# Patient Record
Sex: Female | Born: 1985 | Race: White | Hispanic: No | Smoking: Current every day smoker
Health system: Southern US, Community
[De-identification: ages and names within clinical notes are randomized; demographics above are authoritative.]

## PROBLEM LIST (undated history)

## (undated) ENCOUNTER — Inpatient Hospital Stay (HOSPITAL_COMMUNITY): Payer: Self-pay

## (undated) DIAGNOSIS — M5136 Other intervertebral disc degeneration, lumbar region: Secondary | ICD-10-CM

## (undated) DIAGNOSIS — N390 Urinary tract infection, site not specified: Secondary | ICD-10-CM

## (undated) DIAGNOSIS — F329 Major depressive disorder, single episode, unspecified: Secondary | ICD-10-CM

## (undated) DIAGNOSIS — O34219 Maternal care for unspecified type scar from previous cesarean delivery: Secondary | ICD-10-CM

## (undated) DIAGNOSIS — J45909 Unspecified asthma, uncomplicated: Secondary | ICD-10-CM

## (undated) DIAGNOSIS — F32A Depression, unspecified: Secondary | ICD-10-CM

## (undated) DIAGNOSIS — B029 Zoster without complications: Secondary | ICD-10-CM

## (undated) DIAGNOSIS — IMO0002 Reserved for concepts with insufficient information to code with codable children: Secondary | ICD-10-CM

## (undated) DIAGNOSIS — M51369 Other intervertebral disc degeneration, lumbar region without mention of lumbar back pain or lower extremity pain: Secondary | ICD-10-CM

## (undated) HISTORY — PX: KNEE SURGERY: SHX244

## (undated) HISTORY — DX: Unspecified asthma, uncomplicated: J45.909

## (undated) HISTORY — DX: Zoster without complications: B02.9

## (undated) HISTORY — DX: Maternal care for unspecified type scar from previous cesarean delivery: O34.219

## (undated) HISTORY — DX: Depression, unspecified: F32.A

## (undated) HISTORY — DX: Major depressive disorder, single episode, unspecified: F32.9

## (undated) SURGERY — Surgical Case
Anesthesia: Regional

---
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
## 2008-08-04 HISTORY — PX: DILATION AND CURETTAGE OF UTERUS: SHX78

## 2009-09-18 ENCOUNTER — Other Ambulatory Visit: Payer: Self-pay | Admitting: Emergency Medicine

## 2009-09-18 ENCOUNTER — Inpatient Hospital Stay (HOSPITAL_COMMUNITY): Admission: AD | Admit: 2009-09-18 | Discharge: 2009-09-20 | Payer: Self-pay | Admitting: Obstetrics & Gynecology

## 2009-09-18 ENCOUNTER — Ambulatory Visit: Payer: Self-pay | Admitting: Family

## 2009-12-12 ENCOUNTER — Ambulatory Visit: Payer: Self-pay | Admitting: Family Medicine

## 2009-12-12 ENCOUNTER — Inpatient Hospital Stay (HOSPITAL_COMMUNITY): Admission: AD | Admit: 2009-12-12 | Discharge: 2009-12-12 | Payer: Self-pay | Admitting: Obstetrics & Gynecology

## 2009-12-27 ENCOUNTER — Inpatient Hospital Stay (HOSPITAL_COMMUNITY): Admission: AD | Admit: 2009-12-27 | Discharge: 2009-12-27 | Payer: Self-pay | Admitting: Obstetrics and Gynecology

## 2010-01-02 ENCOUNTER — Inpatient Hospital Stay (HOSPITAL_COMMUNITY): Admission: RE | Admit: 2010-01-02 | Discharge: 2010-01-04 | Payer: Self-pay | Admitting: Obstetrics and Gynecology

## 2010-02-01 ENCOUNTER — Emergency Department (HOSPITAL_COMMUNITY): Admission: EM | Admit: 2010-02-01 | Discharge: 2010-02-01 | Payer: Self-pay | Admitting: Emergency Medicine

## 2010-03-27 ENCOUNTER — Emergency Department (HOSPITAL_COMMUNITY): Admission: EM | Admit: 2010-03-27 | Discharge: 2010-03-27 | Payer: Self-pay | Admitting: Emergency Medicine

## 2010-04-02 ENCOUNTER — Emergency Department (HOSPITAL_COMMUNITY): Admission: EM | Admit: 2010-04-02 | Discharge: 2010-04-02 | Payer: Self-pay | Admitting: Emergency Medicine

## 2010-06-19 ENCOUNTER — Emergency Department (HOSPITAL_COMMUNITY): Admission: EM | Admit: 2010-06-19 | Discharge: 2010-06-19 | Payer: Self-pay | Admitting: Emergency Medicine

## 2010-10-15 LAB — CBC
Hemoglobin: 11.9 g/dL — ABNORMAL LOW (ref 12.0–15.0)
MCH: 28.8 pg (ref 26.0–34.0)
MCHC: 33.2 g/dL (ref 30.0–36.0)
Platelets: 247 10*3/uL (ref 150–400)
RBC: 4.11 MIL/uL (ref 3.87–5.11)

## 2010-10-15 LAB — COMPREHENSIVE METABOLIC PANEL
ALT: 12 U/L (ref 0–35)
AST: 17 U/L (ref 0–37)
Albumin: 4.4 g/dL (ref 3.5–5.2)
CO2: 25 mEq/L (ref 19–32)
Calcium: 9.5 mg/dL (ref 8.4–10.5)
Creatinine, Ser: 0.99 mg/dL (ref 0.4–1.2)
GFR calc Af Amer: >60 mL/min (ref >60)
GFR calc non Af Amer: >60 mL/min (ref >60)
Sodium: 137 mEq/L (ref 135–145)
Total Protein: 6.9 g/dL (ref 6.0–8.3)

## 2010-10-15 LAB — DIFFERENTIAL
Eosinophils Absolute: 0.2 10*3/uL (ref 0.0–0.7)
Eosinophils Relative: 2 % (ref 0–5)
Lymphocytes Relative: 28 % (ref 12–46)
Lymphs Abs: 2.4 10*3/uL (ref 0.7–4.0)
Monocytes Absolute: 0.5 10*3/uL (ref 0.1–1.0)
Monocytes Relative: 6 % (ref 3–12)

## 2010-10-15 LAB — LIPASE, BLOOD: Lipase: 37 U/L (ref 11–59)

## 2010-10-15 LAB — POCT PREGNANCY, URINE: Preg Test, Ur: NEGATIVE

## 2010-10-18 LAB — URINALYSIS, ROUTINE W REFLEX MICROSCOPIC
Bilirubin Urine: NEGATIVE
Glucose, UA: NEGATIVE mg/dL
Ketones, ur: NEGATIVE mg/dL
Ketones, ur: NEGATIVE mg/dL
Nitrite: NEGATIVE
Nitrite: POSITIVE — AB
Protein, ur: NEGATIVE mg/dL
Specific Gravity, Urine: 1.015 (ref 1.005–1.030)
pH: 6.5 (ref 5.0–8.0)

## 2010-10-18 LAB — BASIC METABOLIC PANEL
CO2: 24 mEq/L (ref 19–32)
Calcium: 9.8 mg/dL (ref 8.4–10.5)
Creatinine, Ser: 0.87 mg/dL (ref 0.4–1.2)
GFR calc Af Amer: >60 mL/min (ref >60)
GFR calc non Af Amer: >60 mL/min (ref >60)
Sodium: 138 mEq/L (ref 135–145)

## 2010-10-18 LAB — URINE MICROSCOPIC-ADD ON

## 2010-10-18 LAB — DIFFERENTIAL
Basophils Relative: 0 % (ref 0–1)
Eosinophils Absolute: 0.2 10*3/uL (ref 0.0–0.7)
Lymphs Abs: 3 10*3/uL (ref 0.7–4.0)
Neutro Abs: 5.4 10*3/uL (ref 1.7–7.7)
Neutrophils Relative %: 59 % (ref 43–77)

## 2010-10-18 LAB — PREGNANCY, URINE: Preg Test, Ur: NEGATIVE

## 2010-10-18 LAB — CBC
MCH: 29.3 pg (ref 26.0–34.0)
MCHC: 33.4 g/dL (ref 30.0–36.0)
Platelets: 225 10*3/uL (ref 150–400)
RBC: 4.63 MIL/uL (ref 3.87–5.11)

## 2010-10-21 LAB — CBC
MCHC: 35.3 g/dL (ref 30.0–36.0)
Platelets: 157 10*3/uL (ref 150–400)
RBC: 3.39 MIL/uL — ABNORMAL LOW (ref 3.87–5.11)
RDW: 14 % (ref 11.5–15.5)
WBC: 12 10*3/uL — ABNORMAL HIGH (ref 4.0–10.5)

## 2010-10-21 LAB — URINALYSIS, ROUTINE W REFLEX MICROSCOPIC
Glucose, UA: NEGATIVE mg/dL
Protein, ur: NEGATIVE mg/dL
Specific Gravity, Urine: 1.02 (ref 1.005–1.030)
pH: 7.5 (ref 5.0–8.0)

## 2010-10-21 LAB — RPR: RPR Ser Ql: NONREACTIVE

## 2010-10-22 LAB — URINALYSIS, ROUTINE W REFLEX MICROSCOPIC
Ketones, ur: NEGATIVE mg/dL
Nitrite: NEGATIVE
pH: 7 (ref 5.0–8.0)

## 2010-10-24 LAB — ABO/RH: ABO/RH(D): A POS

## 2010-10-24 LAB — DIFFERENTIAL
Lymphocytes Relative: 12 % (ref 12–46)
Lymphs Abs: 1.9 10*3/uL (ref 0.7–4.0)
Neutro Abs: 13.9 10*3/uL — ABNORMAL HIGH (ref 1.7–7.7)
Neutrophils Relative %: 83 % — ABNORMAL HIGH (ref 43–77)

## 2010-10-24 LAB — BASIC METABOLIC PANEL
BUN: 4 mg/dL — ABNORMAL LOW (ref 6–23)
Creatinine, Ser: 0.49 mg/dL (ref 0.4–1.2)
GFR calc non Af Amer: >60 mL/min (ref >60)
Potassium: 3.7 mEq/L (ref 3.5–5.1)

## 2010-10-24 LAB — CBC
HCT: 37 % (ref 36.0–46.0)
Platelets: 208 10*3/uL (ref 150–400)
WBC: 16.7 10*3/uL — ABNORMAL HIGH (ref 4.0–10.5)

## 2010-10-24 LAB — HCG, QUANTITATIVE, PREGNANCY: hCG, Beta Chain, Quant, S: 11627 m[IU]/mL — ABNORMAL HIGH (ref <5)

## 2010-10-24 LAB — PROTIME-INR
INR: 0.93 (ref 0.00–1.49)
Prothrombin Time: 12.4 seconds (ref 11.6–15.2)

## 2010-10-24 LAB — CULTURE, BETA STREP (GROUP B ONLY)

## 2010-10-24 LAB — URINALYSIS, ROUTINE W REFLEX MICROSCOPIC
Ketones, ur: 15 mg/dL — AB
Nitrite: NEGATIVE
Protein, ur: NEGATIVE mg/dL

## 2010-10-24 LAB — FETAL FIBRONECTIN: Fetal Fibronectin: NEGATIVE

## 2010-10-24 LAB — RAPID URINE DRUG SCREEN, HOSP PERFORMED
Benzodiazepines: NOT DETECTED
Cocaine: NOT DETECTED

## 2012-01-17 ENCOUNTER — Encounter (HOSPITAL_COMMUNITY): Payer: Self-pay | Admitting: *Deleted

## 2012-01-17 ENCOUNTER — Emergency Department (HOSPITAL_COMMUNITY)
Admission: EM | Admit: 2012-01-17 | Discharge: 2012-01-17 | Disposition: A | Discharge status: Home / Self Care | Payer: Self-pay | Attending: Emergency Medicine | Admitting: Emergency Medicine

## 2012-01-17 ENCOUNTER — Emergency Department (HOSPITAL_COMMUNITY): Payer: Self-pay

## 2012-01-17 DIAGNOSIS — Z91040 Latex allergy status: Secondary | ICD-10-CM | POA: Insufficient documentation

## 2012-01-17 DIAGNOSIS — W108XXA Fall (on) (from) other stairs and steps, initial encounter: Secondary | ICD-10-CM | POA: Insufficient documentation

## 2012-01-17 DIAGNOSIS — R300 Dysuria: Secondary | ICD-10-CM | POA: Insufficient documentation

## 2012-01-17 DIAGNOSIS — S20229A Contusion of unspecified back wall of thorax, initial encounter: Secondary | ICD-10-CM | POA: Insufficient documentation

## 2012-01-17 DIAGNOSIS — S300XXA Contusion of lower back and pelvis, initial encounter: Secondary | ICD-10-CM

## 2012-01-17 DIAGNOSIS — F17200 Nicotine dependence, unspecified, uncomplicated: Secondary | ICD-10-CM | POA: Insufficient documentation

## 2012-01-17 DIAGNOSIS — Z880 Allergy status to penicillin: Secondary | ICD-10-CM | POA: Insufficient documentation

## 2012-01-17 HISTORY — DX: Reserved for concepts with insufficient information to code with codable children: IMO0002

## 2012-01-17 HISTORY — DX: Other intervertebral disc degeneration, lumbar region: M51.36

## 2012-01-17 HISTORY — DX: Other intervertebral disc degeneration, lumbar region without mention of lumbar back pain or lower extremity pain: M51.369

## 2012-01-17 LAB — URINALYSIS, ROUTINE W REFLEX MICROSCOPIC
Bilirubin Urine: NEGATIVE
Leukocytes, UA: NEGATIVE
Nitrite: NEGATIVE
Specific Gravity, Urine: 1.02 (ref 1.005–1.030)
pH: 6.5 (ref 5.0–8.0)

## 2012-01-17 LAB — PREGNANCY, URINE: Preg Test, Ur: NEGATIVE

## 2012-01-17 MED ORDER — OXYCODONE-ACETAMINOPHEN 5-325 MG PO TABS
ORAL_TABLET | ORAL | Status: AC
  Filled 2012-01-17: qty 1

## 2012-01-17 MED ORDER — OXYCODONE-ACETAMINOPHEN 5-325 MG PO TABS: ORAL_TABLET | ORAL | Status: DC

## 2012-01-17 MED ORDER — OXYCODONE-ACETAMINOPHEN 5-325 MG PO TABS
1.0000 | ORAL_TABLET | Freq: Once | ORAL | Status: AC
  Administered 2012-01-17: 1 via ORAL

## 2012-01-17 MED ORDER — HYDROCODONE-ACETAMINOPHEN 5-325 MG PO TABS
1.0000 | ORAL_TABLET | Freq: Once | ORAL | Status: DC
  Filled 2012-01-17: qty 1

## 2012-01-17 NOTE — Discharge Instructions (Signed)
Contusion A contusion is a deep bruise. Contusions are the result of an injury that caused bleeding under the skin. The contusion may turn blue, purple, or yellow. Minor injuries will give you a painless contusion, but more severe contusions may stay painful and swollen for a few weeks.  CAUSES  A contusion is usually caused by a blow, trauma, or direct force to an area of the body. SYMPTOMS   Swelling and redness of the injured area.   Bruising of the injured area.   Tenderness and soreness of the injured area.   Pain.  DIAGNOSIS  The diagnosis can be made by taking a history and physical exam. An X-ray, CT scan, or MRI may be needed to determine if there were any associated injuries, such as fractures. TREATMENT  Specific treatment will depend on what area of the body was injured. In general, the best treatment for a contusion is resting, icing, elevating, and applying cold compresses to the injured area. Over-the-counter medicines may also be recommended for pain control. Ask your caregiver what the best treatment is for your contusion. HOME CARE INSTRUCTIONS   Put ice on the injured area.   Put ice in a plastic bag.   Place a towel between your skin and the bag.   Leave the ice on for 15 to 20 minutes, 3 to 4 times a day.   Only take over-the-counter or prescription medicines for pain, discomfort, or fever as directed by your caregiver. Your caregiver may recommend avoiding anti-inflammatory medicines (aspirin, ibuprofen, and naproxen) for 48 hours because these medicines may increase bruising.   Rest the injured area.   If possible, elevate the injured area to reduce swelling.  SEEK IMMEDIATE MEDICAL CARE IF:   You have increased bruising or swelling.   You have pain that is getting worse.   Your swelling or pain is not relieved with medicines.  MAKE SURE YOU:   Understand these instructions.   Will watch your condition.   Will get help right away if you are not  doing well or get worse.  Document Released: 04/30/2005 Document Revised: 07/10/2011 Document Reviewed: 05/26/2011 [facility] Patient Information 2012 [place], [place].Cryotherapy Cryotherapy means treatment with cold. Ice or gel packs can be used to reduce both pain and swelling. Ice is the most helpful within the first 24 to 48 hours after an injury or flareup from overusing a muscle or joint. Sprains, strains, spasms, burning pain, shooting pain, and aches can all be eased with ice. Ice can also be used when recovering from surgery. Ice is effective, has very few side effects, and is safe for most people to use. PRECAUTIONS  Ice is not a safe treatment option for people with:  Raynaud's phenomenon. This is a condition affecting small blood vessels in the extremities. Exposure to cold may cause your problems to return.   Cold hypersensitivity. There are many forms of cold hypersensitivity, including:   Cold urticaria. Red, itchy hives appear on the skin when the tissues begin to warm after being iced.   Cold erythema. This is a red, itchy rash caused by exposure to cold.   Cold hemoglobinuria. Red blood cells break down when the tissues begin to warm after being iced. The hemoglobin that carry oxygen are passed into the urine because they cannot combine with blood proteins fast enough.   Numbness or altered sensitivity in the area being iced.  If you have any of the following conditions, do not use ice until you have discussed  cryotherapy with your caregiver:  Heart conditions, such as arrhythmia, angina, or chronic heart disease.   High blood pressure.   Healing wounds or open skin in the area being iced.   Current infections.   Rheumatoid arthritis.   Poor circulation.   Diabetes.  Ice slows the blood flow in the region it is applied. This is beneficial when trying to stop inflamed tissues from spreading irritating chemicals to surrounding tissues. However, if you expose your skin  to cold temperatures for too long or without the proper protection, you can damage your skin or nerves. Watch for signs of skin damage due to cold. HOME CARE INSTRUCTIONS Follow these tips to use ice and cold packs safely.  Place a dry or damp towel between the ice and skin. A damp towel will cool the skin more quickly, so you may need to shorten the time that the ice is used.   For a more rapid response, add gentle compression to the ice.   Ice for no more than 10 to 20 minutes at a time. The bonier the area you are icing, the less time it will take to get the benefits of ice.   Check your skin after 5 minutes to make sure there are no signs of a poor response to cold or skin damage.   Rest 20 minutes or more in between uses.   Once your skin is numb, you can end your treatment. You can test numbness by very lightly touching your skin. The touch should be so light that you do not see the skin dimple from the pressure of your fingertip. When using ice, most people will feel these normal sensations in this order: cold, burning, aching, and numbness.   Do not use ice on someone who cannot communicate their responses to pain, such as small children or people with dementia.  HOW TO MAKE AN ICE PACK Ice packs are the most common way to use ice therapy. Other methods include ice massage, ice baths, and cryo-sprays. Muscle creams that cause a cold, tingly feeling do not offer the same benefits that ice offers and should not be used as a substitute unless recommended by your caregiver. To make an ice pack, do one of the following:  Place crushed ice or a bag of frozen vegetables in a sealable plastic bag. Squeeze out the excess air. Place this bag inside another plastic bag. Slide the bag into a pillowcase or place a damp towel between your skin and the bag.   Mix 3 parts water with 1 part rubbing alcohol. Freeze the mixture in a sealable plastic bag. When you remove the mixture from the freezer, it  will be slushy. Squeeze out the excess air. Place this bag inside another plastic bag. Slide the bag into a pillowcase or place a damp towel between your skin and the bag.  SEEK MEDICAL CARE IF:  You develop white spots on your skin. This may give the skin a blotchy (mottled) appearance.   Your skin turns blue or pale.   Your skin becomes waxy or hard.   Your swelling gets worse.  MAKE SURE YOU:   Understand these instructions.   Will watch your condition.   Will get help right away if you are not doing well or get worse.  Document Released: 03/17/2011 Document Revised: 07/10/2011 Document Reviewed: 03/17/2011 [facility] Patient Information 2012 [place], [place].   Take the pain medicine as directed.  Take ibuprofen 800 mg every 8 hrs with food.  Apply ice several times daily to area of soreness.  The x-rays show no new problems or any acute bony abnormalities.

## 2012-01-17 NOTE — ED Notes (Signed)
Pt was coming down her steps this am, pt states that she was being "clumsy" and fell down the steps, c/o  Lower back pain that radiates down right leg and pain to left shoulder.

## 2012-01-17 NOTE — ED Provider Notes (Signed)
Medical screening examination/treatment/procedure(s) were performed by non-physician practitioner and as supervising physician I was immediately available for consultation/collaboration.   Dawon Troop B. Prudencio Velazco, MD 01/17/12 2131 

## 2012-01-17 NOTE — ED Provider Notes (Signed)
History     CSN: [id number]  Arrival date & time 01/17/12  1657   First MD Initiated Contact with Patient 01/17/12 1801      Chief Complaint  Patient presents with  . Back Pain    (Consider location/radiation/quality/duration/timing/severity/associated sxs/prior treatment) HPI Comments: Larey Seat while descending stairs this AM and struck lower back  On steps.  H/o chronic back pain and DDD.  Just moved here from [place].  Took ibuprofen 800 mg right after fall.  Patient is a 26 y.o. female presenting with back pain. The history is provided by the patient. No language interpreter was used.  Back Pain  This is a new problem. The problem occurs constantly. The pain is associated with falling. The pain is present in the lumbar spine. The quality of the pain is described as stabbing. The pain does not radiate. The pain is severe. The symptoms are aggravated by bending and certain positions. The pain is the same all the time. Associated symptoms include dysuria. Pertinent negatives include no numbness, no pelvic pain, no leg pain and no weakness. She has tried NSAIDs for the symptoms.    Past Medical History  Diagnosis Date  . Degenerative disc disease, lumbar   . Disc herniation     Past Surgical History  Procedure Date  . Knee surgery     No family history on file.  History  Substance Use Topics  . Smoking status: Current Everyday Smoker  . Smokeless tobacco: Not on file  . Alcohol Use: No    OB History    Grav Para Term Preterm Abortions TAB SAB Ect Mult Living                  Review of Systems  Genitourinary: Positive for dysuria. Negative for pelvic pain.  Musculoskeletal: Positive for back pain.  Neurological: Negative for weakness and numbness.  All other systems reviewed and are negative.    Allergies  Hydrocodone; Latex; and Penicillins  Home Medications   Current Outpatient Rx  Name Route Sig Dispense Refill  . OXYCODONE-ACETAMINOPHEN 5-325 MG PO TABS   One tab po QID prn pain 20 tablet 0    BP 104/56  Pulse 114  Temp 97.8 F (36.6 C) (Oral)  Resp 20  Ht 5\' 4"  (1.626 m)  Wt 111 lb (50.349 kg)  BMI 19.05 kg/m2  SpO2 100%  LMP 01/12/2012  Physical Exam  Nursing note and vitals reviewed. Constitutional: She is oriented to person, place, and time. She appears well-developed and well-nourished. No distress.  HENT:  Head: Normocephalic and atraumatic.  Eyes: EOM are normal.  Neck: Normal range of motion.  Cardiovascular: Normal rate, regular rhythm and normal heart sounds.   Pulmonary/Chest: Effort normal and breath sounds normal.  Abdominal: Soft. She exhibits no distension. There is no tenderness.  Musculoskeletal: She exhibits tenderness.       Back:  Neurological: She is alert and oriented to person, place, and time.  Skin: Skin is warm and dry.  Psychiatric: She has a normal mood and affect. Judgment normal.    ED Course  Procedures (including critical care time)   Labs Reviewed  URINALYSIS, ROUTINE W REFLEX MICROSCOPIC  PREGNANCY, URINE   Dg Lumbar Spine Complete  01/17/2012  *RADIOLOGY REPORT*  Clinical Data: Low back pain extending down the right leg after fall today.  Prior fall 1 year ago.  LUMBAR SPINE - COMPLETE 4+ VIEW  Comparison: 04/02/2010 CT abdomen and pelvis.  Findings: Mild L5-S1 disc space  narrowing.  No new compression fracture detected.  Very mild dextroscoliosis.  IMPRESSION: No acute fracture.  L5-S1 disc space narrowing as noted on prior CT.  Original Report Authenticated By: Fuller Canada, M.D.     1. Contusion of lower back       MDM  No acute abnorm on x-ray Ice, ibuprofen rx-percocet, [address] [place] 01/17/12 1934

## 2012-01-17 NOTE — ED Notes (Signed)
Pt alert & oriented x4, stable gait. Pt given discharge instructions, paperwork & prescription(s). Patient instructed to stop at the registration desk to finish any additional paperwork. pt verbalized understanding. Pt left department w/ no further questions.  

## 2012-03-28 ENCOUNTER — Emergency Department (HOSPITAL_COMMUNITY)
Admission: EM | Admit: 2012-03-28 | Discharge: 2012-03-29 | Disposition: A | Discharge status: Home / Self Care | Payer: Medicaid Other | Attending: Emergency Medicine | Admitting: Emergency Medicine

## 2012-03-28 ENCOUNTER — Encounter (HOSPITAL_COMMUNITY): Payer: Self-pay | Admitting: *Deleted

## 2012-03-28 ENCOUNTER — Emergency Department (HOSPITAL_COMMUNITY): Payer: Medicaid Other

## 2012-03-28 DIAGNOSIS — F17200 Nicotine dependence, unspecified, uncomplicated: Secondary | ICD-10-CM | POA: Insufficient documentation

## 2012-03-28 DIAGNOSIS — M5137 Other intervertebral disc degeneration, lumbosacral region: Secondary | ICD-10-CM | POA: Insufficient documentation

## 2012-03-28 DIAGNOSIS — N390 Urinary tract infection, site not specified: Secondary | ICD-10-CM | POA: Insufficient documentation

## 2012-03-28 DIAGNOSIS — R319 Hematuria, unspecified: Secondary | ICD-10-CM

## 2012-03-28 DIAGNOSIS — M51379 Other intervertebral disc degeneration, lumbosacral region without mention of lumbar back pain or lower extremity pain: Secondary | ICD-10-CM | POA: Insufficient documentation

## 2012-03-28 DIAGNOSIS — N200 Calculus of kidney: Secondary | ICD-10-CM | POA: Insufficient documentation

## 2012-03-28 LAB — URINALYSIS, ROUTINE W REFLEX MICROSCOPIC
Ketones, ur: NEGATIVE mg/dL
Leukocytes, UA: NEGATIVE
Nitrite: NEGATIVE
Protein, ur: NEGATIVE mg/dL
Urobilinogen, UA: 0.2 mg/dL (ref 0.0–1.0)

## 2012-03-28 LAB — URINE MICROSCOPIC-ADD ON

## 2012-03-28 LAB — BASIC METABOLIC PANEL
Chloride: 101 mEq/L (ref 96–112)
Creatinine, Ser: 0.78 mg/dL (ref 0.50–1.10)
GFR calc Af Amer: >90 mL/min (ref >90)
GFR calc non Af Amer: >90 mL/min (ref >90)
Potassium: 3.9 mEq/L (ref 3.5–5.1)

## 2012-03-28 LAB — CBC WITH DIFFERENTIAL/PLATELET
Basophils Absolute: 0.1 10*3/uL (ref 0.0–0.1)
Basophils Relative: 1 % (ref 0–1)
Eosinophils Absolute: 0.3 10*3/uL (ref 0.0–0.7)
MCHC: 34.5 g/dL (ref 30.0–36.0)
Neutro Abs: 5.5 10*3/uL (ref 1.7–7.7)
Neutrophils Relative %: 51 % (ref 43–77)
RDW: 12.7 % (ref 11.5–15.5)

## 2012-03-28 LAB — PREGNANCY, URINE: Preg Test, Ur: NEGATIVE

## 2012-03-28 MED ORDER — HYDROMORPHONE HCL PF 1 MG/ML IJ SOLN
1.0000 mg | Freq: Once | INTRAMUSCULAR | Status: AC
  Administered 2012-03-28: 1 mg via INTRAVENOUS
  Filled 2012-03-28: qty 1

## 2012-03-28 MED ORDER — SODIUM CHLORIDE 0.9 % IV BOLUS (SEPSIS)
250.0000 mL | Freq: Once | INTRAVENOUS | Status: AC
  Administered 2012-03-28: 250 mL via INTRAVENOUS

## 2012-03-28 MED ORDER — SODIUM CHLORIDE 0.9 % IV SOLN: INTRAVENOUS | Status: DC

## 2012-03-28 MED ORDER — ONDANSETRON HCL 4 MG/2ML IJ SOLN
4.0000 mg | Freq: Once | INTRAMUSCULAR | Status: AC
  Administered 2012-03-28: 4 mg via INTRAVENOUS
  Filled 2012-03-28: qty 2

## 2012-03-28 MED ORDER — CIPROFLOXACIN IN D5W 400 MG/200ML IV SOLN
400.0000 mg | Freq: Once | INTRAVENOUS | Status: AC
  Administered 2012-03-28: 400 mg via INTRAVENOUS
  Filled 2012-03-28: qty 200

## 2012-03-28 NOTE — ED Notes (Signed)
Pt c/o pelvic pain 

## 2012-03-28 NOTE — ED Provider Notes (Signed)
As the the is a for analysis brain that is History    This chart was scribed for Shelda Jakes, MD, MD by Smitty Pluck. The patient was seen in room APA09 and the patient's care was started at 9:33PM.   CSN: [id number]  Arrival date & time 03/28/12  2030   First MD Initiated Contact with Patient 03/28/12 2121      Chief Complaint  Patient presents with  . Pelvic Pain    (Consider location/radiation/quality/duration/timing/severity/associated sxs/prior treatment) Patient is a 26 y.o. female presenting with pelvic pain. The history is provided by the patient.  Pelvic Pain This is a new problem. The current episode started 1 to 2 hours ago. The problem occurs hourly. The problem has not changed since onset.Associated symptoms include abdominal pain. Pertinent negatives include no shortness of breath. Nothing aggravates the symptoms. Nothing relieves the symptoms. She has tried nothing for the symptoms.   Autumn Herrera is a 26 y.o. female who presents to the Emergency Department complaining of intermittent, moderate stabbing left lower quadrant pain onset today at 8PM. LMP was 03-11-12. Pt reports increased frequency of urination. She has urinated 6x since arrival in ED. Pt reports pain lasts for 5 minutes. Pain is rated at 10/10. Denies dysuria, vaginal bleeding and vaginal discharge. Denies hx of kidney stones.   Past Medical History  Diagnosis Date  . Degenerative disc disease, lumbar   . Disc herniation     Past Surgical History  Procedure Date  . Knee surgery     History reviewed. No pertinent family history.  History  Substance Use Topics  . Smoking status: Current Everyday Smoker  . Smokeless tobacco: Not on file  . Alcohol Use: No    OB History    Grav Para Term Preterm Abortions TAB SAB Ect Mult Living                  Review of Systems  Constitutional: Negative for fever and chills.  Respiratory: Negative for shortness of breath.   Gastrointestinal:  Positive for abdominal pain. Negative for nausea, vomiting and diarrhea.  Genitourinary: Positive for frequency and pelvic pain. Negative for dysuria.  Skin: Negative for rash.  Neurological: Negative for weakness.    Allergies  Hydrocodone; Latex; and Penicillins  Home Medications   Current Outpatient Rx  Name Route Sig Dispense Refill  . OXYCODONE-ACETAMINOPHEN 5-325 MG PO TABS Oral Take 1 tablet by mouth 4 (four) times daily as needed. pain    . CIPROFLOXACIN HCL 500 MG PO TABS Oral Take 1 tablet (500 mg total) by mouth 2 (two) times daily. 20 tablet 0  . HYDROMORPHONE HCL 2 MG PO TABS Oral Take 1 tablet (2 mg total) by mouth every 4 (four) hours as needed for pain. 20 tablet 0  . PROMETHAZINE HCL 25 MG PO TABS Oral Take 1 tablet (25 mg total) by mouth every 6 (six) hours as needed for nausea. 12 tablet 0    BP 114/61  Pulse 93  Temp 97.9 F (36.6 C) (Oral)  Resp 22  Ht 5\' 4"  (1.626 m)  Wt 117 lb (53.071 kg)  BMI 20.08 kg/m2  SpO2 100%  LMP 03/08/2012  Physical Exam  Nursing note and vitals reviewed. Constitutional: She is oriented to person, place, and time. She appears well-developed and well-nourished.  HENT:  Head: Normocephalic and atraumatic.  Neck: Normal range of motion. Neck supple.  Cardiovascular: Normal rate, regular rhythm and normal heart sounds.   No murmur heard.  Pulmonary/Chest: Effort normal and breath sounds normal. No respiratory distress. She has no wheezes. She has no rales.  Abdominal: Soft. Bowel sounds are normal. She exhibits no distension. There is no tenderness. There is no CVA tenderness.  Neurological: She is alert and oriented to person, place, and time.  Skin: Skin is warm and dry.  Psychiatric: She has a normal mood and affect. Her behavior is normal.    ED Course  Procedures (including critical care time) DIAGNOSTIC STUDIES: Oxygen Saturation is 100% on room air, normal by my interpretation.    COORDINATION OF CARE: 9:45PM  ordered:   Medications  oxyCODONE-acetaminophen (PERCOCET/ROXICET) 5-325 MG per tablet (not administered)  0.9 %  sodium chloride infusion (not administered)  ciprofloxacin (CIPRO) IVPB 400 mg (400 mg Intravenous New Bag/Given 03/28/12 2317)  ciprofloxacin (CIPRO) 500 MG tablet (not administered)  HYDROmorphone (DILAUDID) 2 MG tablet (not administered)  promethazine (PHENERGAN) 25 MG tablet (not administered)  sodium chloride 0.9 % bolus 250 mL (250 mL Intravenous Given 03/28/12 2211)  ondansetron (ZOFRAN) injection 4 mg (4 mg Intravenous Given 03/28/12 2211)  HYDROmorphone (DILAUDID) injection 1 mg (1 mg Intravenous Given 03/28/12 2212)  HYDROmorphone (DILAUDID) injection 1 mg (1 mg Intravenous Given 03/28/12 2306)      Labs Reviewed  URINALYSIS, ROUTINE W REFLEX MICROSCOPIC - Abnormal; Notable for the following:    APPearance HAZY (*)     Hgb urine dipstick LARGE (*)     All other components within normal limits  CBC WITH DIFFERENTIAL - Abnormal; Notable for the following:    WBC 10.7 (*)     Lymphs Abs 4.1 (*)     All other components within normal limits  BASIC METABOLIC PANEL - Abnormal; Notable for the following:    Sodium 134 (*)     All other components within normal limits  URINE MICROSCOPIC-ADD ON - Abnormal; Notable for the following:    Squamous Epithelial / LPF FEW (*)     Bacteria, UA FEW (*)     All other components within normal limits  PREGNANCY, URINE  URINE CULTURE   Ct Abdomen Pelvis Wo Contrast  03/28/2012  *RADIOLOGY REPORT*  Clinical Data: Left flank pain for 1 day.  CT ABDOMEN AND PELVIS WITHOUT CONTRAST  Technique:  Multidetector CT imaging of the abdomen and pelvis was performed following the standard protocol without intravenous contrast.  Comparison: None.  Findings: Pectus deformity is noted.  Lung bases are clear.  No pleural or pericardial effusion.  There is a punctate nonobstructing stone in the mid pole of the right kidney. No ureteral stones or  hydronephrosis identified.  No left renal stones are seen.  The liver, gallbladder, spleen, adrenal glands and pancreas all appear normal.  The stomach and small large bowel appear normal.  No focal bony abnormality.  IMPRESSION:  1.  No acute finding. 2.  Punctate nonobstructing stone mid pole right kidney. 3.  Pectus deformity.   Original Report Authenticated By: Bernadene Bell. Maricela Curet, M.D.    Results for orders placed during the hospital encounter of 03/28/12  PREGNANCY, URINE      Component Value Range   Preg Test, Ur NEGATIVE  NEGATIVE  URINALYSIS, ROUTINE W REFLEX MICROSCOPIC      Component Value Range   Color, Urine YELLOW  YELLOW   APPearance HAZY (*) CLEAR   Specific Gravity, Urine 1.025  1.005 - 1.030   pH 6.5  5.0 - 8.0   Glucose, UA NEGATIVE  NEGATIVE mg/dL   Hgb  urine dipstick LARGE (*) NEGATIVE   Bilirubin Urine NEGATIVE  NEGATIVE   Ketones, ur NEGATIVE  NEGATIVE mg/dL   Protein, ur NEGATIVE  NEGATIVE mg/dL   Urobilinogen, UA 0.2  0.0 - 1.0 mg/dL   Nitrite NEGATIVE  NEGATIVE   Leukocytes, UA NEGATIVE  NEGATIVE  CBC WITH DIFFERENTIAL      Component Value Range   WBC 10.7 (*) 4.0 - 10.5 K/uL   RBC 4.60  3.87 - 5.11 MIL/uL   Hemoglobin 14.0  12.0 - 15.0 g/dL   HCT 29.5  62.1 - 30.8 %   MCV 88.3  78.0 - 100.0 fL   MCH 30.4  26.0 - 34.0 pg   MCHC 34.5  30.0 - 36.0 g/dL   RDW 65.7  84.6 - 96.2 %   Platelets 223  150 - 400 K/uL   Neutrophils Relative 51  43 - 77 %   Neutro Abs 5.5  1.7 - 7.7 K/uL   Lymphocytes Relative 38  12 - 46 %   Lymphs Abs 4.1 (*) 0.7 - 4.0 K/uL   Monocytes Relative 7  3 - 12 %   Monocytes Absolute 0.8  0.1 - 1.0 K/uL   Eosinophils Relative 3  0 - 5 %   Eosinophils Absolute 0.3  0.0 - 0.7 K/uL   Basophils Relative 1  0 - 1 %   Basophils Absolute 0.1  0.0 - 0.1 K/uL  BASIC METABOLIC PANEL      Component Value Range   Sodium 134 (*) 135 - 145 mEq/L   Potassium 3.9  3.5 - 5.1 mEq/L   Chloride 101  96 - 112 mEq/L   CO2 25  19 - 32 mEq/L    Glucose, Bld 91  70 - 99 mg/dL   BUN 15  6 - 23 mg/dL   Creatinine, Ser 9.52  0.50 - 1.10 mg/dL   Calcium 84.1  8.4 - 32.4 mg/dL   GFR calc non Af Amer >90  >90 mL/min   GFR calc Af Amer >90  >90 mL/min  URINE MICROSCOPIC-ADD ON      Component Value Range   Squamous Epithelial / LPF FEW (*) RARE   WBC, UA 7-10  <3 WBC/hpf   RBC / HPF TOO NUMEROUS TO COUNT  <3 RBC/hpf   Bacteria, UA FEW (*) RARE    1. Urinary tract infection   2. Hematuria       MDM   Patient's clinical symptoms very suggestive of a kidney stone CT scan now shows only a stone up in the kidney area no ureteral stones. She may have passed a stone significant hematuria the hematuria could be related to urinary tract infection but still clinically very suspicious that this may have been a passage of a kidney stone. Patient tried treated with IV Cipro in the emergency partner urine culture was sent patient was not treated with Rocephin because she has a very strong penicillin allergy. She'll be discharged home with Cipro and pain medicine and antinausea medicine. Patient will return if not improving in one to 2 days or if she is worse.   I personally performed the services described in this documentation, which was scribed in my presence. The recorded information has been reviewed and considered.     Shelda Jakes, MD 03/29/12 [phone]

## 2012-03-28 NOTE — ED Notes (Signed)
Pt c/o left side pelvic pain x1 hour. Pt describes pain as sharp and intermittent. Pt denies any unusual bleeding or discharge. Pt states she has been "peeing a lot" since pain began but denies any discomfort when she urinates.

## 2012-03-29 MED ORDER — HYDROMORPHONE HCL 2 MG PO TABS: 2.0000 mg | ORAL_TABLET | ORAL | Status: AC | PRN

## 2012-03-29 MED ORDER — CIPROFLOXACIN HCL 500 MG PO TABS: 500.0000 mg | ORAL_TABLET | Freq: Two times a day (BID) | ORAL | Status: AC

## 2012-03-29 MED ORDER — PROMETHAZINE HCL 25 MG PO TABS: 25.0000 mg | ORAL_TABLET | Freq: Four times a day (QID) | ORAL | Status: DC | PRN

## 2012-03-30 LAB — URINE CULTURE: Colony Count: 50000

## 2012-04-27 ENCOUNTER — Encounter (HOSPITAL_COMMUNITY): Payer: Self-pay

## 2012-04-27 ENCOUNTER — Emergency Department (HOSPITAL_COMMUNITY)
Admission: EM | Admit: 2012-04-27 | Discharge: 2012-04-27 | Disposition: A | Discharge status: Home / Self Care | Payer: Medicaid Other | Attending: Emergency Medicine | Admitting: Emergency Medicine

## 2012-04-27 DIAGNOSIS — M51379 Other intervertebral disc degeneration, lumbosacral region without mention of lumbar back pain or lower extremity pain: Secondary | ICD-10-CM | POA: Insufficient documentation

## 2012-04-27 DIAGNOSIS — M545 Low back pain: Secondary | ICD-10-CM

## 2012-04-27 DIAGNOSIS — F17200 Nicotine dependence, unspecified, uncomplicated: Secondary | ICD-10-CM | POA: Insufficient documentation

## 2012-04-27 DIAGNOSIS — M5137 Other intervertebral disc degeneration, lumbosacral region: Secondary | ICD-10-CM | POA: Insufficient documentation

## 2012-04-27 MED ORDER — HYDROCODONE-ACETAMINOPHEN 5-325 MG PO TABS
1.0000 | ORAL_TABLET | Freq: Once | ORAL | Status: AC
  Administered 2012-04-27: 1 via ORAL
  Filled 2012-04-27: qty 1

## 2012-04-27 MED ORDER — PREDNISONE 20 MG PO TABS
60.0000 mg | ORAL_TABLET | Freq: Once | ORAL | Status: AC
  Administered 2012-04-27: 60 mg via ORAL
  Filled 2012-04-27: qty 3

## 2012-04-27 MED ORDER — OXYCODONE-ACETAMINOPHEN 5-325 MG PO TABS
ORAL_TABLET | ORAL | Status: DC
Start: 2012-04-27 — End: 2012-05-05

## 2012-04-27 MED ORDER — OXYCODONE-ACETAMINOPHEN 5-325 MG PO TABS
ORAL_TABLET | ORAL | Status: AC
  Administered 2012-04-27: 1
  Filled 2012-04-27: qty 1

## 2012-04-27 MED ORDER — PREDNISONE 50 MG PO TABS: 50.0000 mg | ORAL_TABLET | Freq: Every day | ORAL | Status: DC

## 2012-04-27 NOTE — ED Provider Notes (Signed)
History     CSN: [id number]  Arrival date & time 04/27/12  1107   First MD Initiated Contact with Patient 04/27/12 1131      Chief Complaint  Patient presents with  . Back Pain    (Consider location/radiation/quality/duration/timing/severity/associated sxs/prior treatment) HPI Comments: Just moved here from [place].  Lifting heavy boxes.  Pain radiates to R knee.  No other complaints.  No incontinence   Patient is a 26 y.o. female presenting with back pain. The history is provided by the patient. No language interpreter was used.  Back Pain  This is a new problem. Episode onset: 1 week ag. The problem occurs constantly. The problem has not changed since onset.The pain is associated with lifting heavy objects. The pain is the same all the time. Pertinent negatives include no fever, no numbness and no weakness. She has tried NSAIDs for the symptoms. The treatment provided no relief.    Past Medical History  Diagnosis Date  . Degenerative disc disease, lumbar   . Disc herniation     Past Surgical History  Procedure Date  . Knee surgery   . Cesarean section     No family history on file.  History  Substance Use Topics  . Smoking status: Current Every Day Smoker  . Smokeless tobacco: Not on file  . Alcohol Use: No    OB History    Grav Para Term Preterm Abortions TAB SAB Ect Mult Living                  Review of Systems  Constitutional: Negative for fever.  Musculoskeletal: Positive for back pain.  Neurological: Negative for weakness and numbness.  All other systems reviewed and are negative.    Allergies  Hydrocodone; Latex; and Penicillins  Home Medications   Current Outpatient Rx  Name Route Sig Dispense Refill  . IBUPROFEN 200 MG PO TABS Oral Take 800 mg by mouth every 6 (six) hours as needed. Pain    . OXYCODONE-ACETAMINOPHEN 5-325 MG PO TABS  One tab po q 6 hrs prn pain 20 tablet 0  . PREDNISONE 50 MG PO TABS Oral Take 1 tablet (50 mg total) by  mouth daily. 6 tablet 0    BP 127/76  Pulse 100  Temp 97.8 F (36.6 C) (Oral)  Resp 20  Ht 5\' 4"  (1.626 m)  Wt 118 lb (53.524 kg)  BMI 20.25 kg/m2  SpO2 100%  LMP 04/12/2012  Physical Exam  Nursing note and vitals reviewed. Constitutional: She is oriented to person, place, and time. She appears well-developed and well-nourished. No distress.  HENT:  Head: Normocephalic and atraumatic.  Eyes: EOM are normal.  Neck: Normal range of motion.  Cardiovascular: Normal rate, regular rhythm and normal heart sounds.   Pulmonary/Chest: Effort normal and breath sounds normal.  Abdominal: Soft. She exhibits no distension. There is no tenderness.  Musculoskeletal: She exhibits tenderness.       Lumbar back: She exhibits decreased range of motion, tenderness and pain. She exhibits no bony tenderness.       Back:  Neurological: She is alert and oriented to person, place, and time.  Skin: Skin is warm and dry.  Psychiatric: She has a normal mood and affect. Judgment normal.    ED Course  Procedures (including critical care time)  Labs Reviewed - No data to display No results found.   1. Acute lumbar back pain       MDM  Ice rx-percocet, 20 rx-prednisone 50 mg  Evalina Field, [place] 04/27/12 1222

## 2012-04-27 NOTE — ED Notes (Signed)
Low back pain for 1 week.  Says she "overdid" it and hurt her back, says she has chronic back problems and DDD.

## 2012-04-27 NOTE — ED Notes (Signed)
Pt says has been moving and last week hurt lower back picking up a box.  Reports pain radiates down r leg.

## 2012-04-27 NOTE — ED Notes (Signed)
Pt is allergic to vicodin, not given.

## 2012-04-27 NOTE — ED Provider Notes (Signed)
Medical screening examination/treatment/procedure(s) were performed by non-physician practitioner and as supervising physician I was immediately available for consultation/collaboration.   Denetta Fei L Lucilia Yanni, MD 04/27/12 1539 

## 2012-04-29 ENCOUNTER — Emergency Department (HOSPITAL_COMMUNITY): Payer: Medicaid Other

## 2012-04-29 ENCOUNTER — Emergency Department (HOSPITAL_COMMUNITY)
Admission: EM | Admit: 2012-04-29 | Discharge: 2012-04-29 | Disposition: A | Discharge status: Home / Self Care | Payer: Medicaid Other | Attending: Emergency Medicine | Admitting: Emergency Medicine

## 2012-04-29 ENCOUNTER — Emergency Department (HOSPITAL_COMMUNITY)
Admission: EM | Admit: 2012-04-29 | Discharge: 2012-04-29 | Discharge status: Against Medical Advice | Payer: Medicaid Other

## 2012-04-29 ENCOUNTER — Encounter (HOSPITAL_COMMUNITY): Payer: Self-pay | Admitting: Emergency Medicine

## 2012-04-29 DIAGNOSIS — M25569 Pain in unspecified knee: Secondary | ICD-10-CM | POA: Insufficient documentation

## 2012-04-29 DIAGNOSIS — Z880 Allergy status to penicillin: Secondary | ICD-10-CM | POA: Insufficient documentation

## 2012-04-29 DIAGNOSIS — F17200 Nicotine dependence, unspecified, uncomplicated: Secondary | ICD-10-CM | POA: Insufficient documentation

## 2012-04-29 DIAGNOSIS — S8002XA Contusion of left knee, initial encounter: Secondary | ICD-10-CM

## 2012-04-29 DIAGNOSIS — Z91040 Latex allergy status: Secondary | ICD-10-CM | POA: Insufficient documentation

## 2012-04-29 MED ORDER — DICLOFENAC SODIUM 75 MG PO TBEC: 75.0000 mg | DELAYED_RELEASE_TABLET | Freq: Two times a day (BID) | ORAL | Status: DC

## 2012-04-29 NOTE — ED Notes (Signed)
Patient with no complaints at this time. Respirations even and unlabored. Skin warm/dry. Discharge instructions reviewed with patient at this time. Patient given opportunity to voice concerns/ask questions. Patient discharged at this time and left Emergency Department with steady gait.   

## 2012-04-29 NOTE — ED Notes (Signed)
No answer when called in triage room.

## 2012-04-29 NOTE — ED Notes (Signed)
No answer when called 

## 2012-04-29 NOTE — ED Provider Notes (Addendum)
History     CSN: [id number]  Arrival date & time 04/29/12  1644   First MD Initiated Contact with Patient 04/29/12 1800      Chief Complaint  Patient presents with  . Knee Pain    (Consider location/radiation/quality/duration/timing/severity/associated sxs/prior treatment) Patient is a 26 y.o. female presenting with knee pain. The history is provided by the patient.  Knee Pain The current episode started in the past 7 days. The problem occurs daily. The problem has been gradually worsening. Associated symptoms include arthralgias and myalgias. Pertinent negatives include no abdominal pain, chest pain, coughing, fever or neck pain. The symptoms are aggravated by standing and walking. She has tried NSAIDs for the symptoms. The treatment provided no relief.    Past Medical History  Diagnosis Date  . Degenerative disc disease, lumbar   . Disc herniation     Past Surgical History  Procedure Date  . Knee surgery   . Cesarean section     No family history on file.  History  Substance Use Topics  . Smoking status: Current Every Day Smoker  . Smokeless tobacco: Not on file  . Alcohol Use: No    OB History    Grav Para Term Preterm Abortions TAB SAB Ect Mult Living                  Review of Systems  Constitutional: Negative for fever and activity change.       All ROS Neg except as noted in HPI  HENT: Negative for nosebleeds and neck pain.   Eyes: Negative for photophobia and discharge.  Respiratory: Negative for cough, shortness of breath and wheezing.   Cardiovascular: Negative for chest pain and palpitations.  Gastrointestinal: Negative for abdominal pain and blood in stool.  Genitourinary: Negative for dysuria, frequency and hematuria.  Musculoskeletal: Positive for myalgias and arthralgias. Negative for back pain.  Skin: Negative.   Neurological: Negative for dizziness, seizures and speech difficulty.  Psychiatric/Behavioral: Negative for hallucinations and  confusion.    Allergies  Hydrocodone; Latex; and Penicillins  Home Medications   Current Outpatient Rx  Name Route Sig Dispense Refill  . IBUPROFEN 200 MG PO TABS Oral Take 800 mg by mouth every 6 (six) hours as needed. Pain    . OXYCODONE-ACETAMINOPHEN 5-325 MG PO TABS  One tab po q 6 hrs prn pain 20 tablet 0  . PREDNISONE 50 MG PO TABS Oral Take 1 tablet (50 mg total) by mouth daily. 6 tablet 0    BP 106/58  Pulse 103  Temp 98.1 F (36.7 C) (Oral)  Resp 20  Ht 5\' 4"  (1.626 m)  Wt 117 lb (53.071 kg)  BMI 20.08 kg/m2  SpO2 100%  LMP 04/12/2012  Physical Exam  Nursing note and vitals reviewed. Constitutional: She is oriented to person, place, and time. She appears well-developed and well-nourished.  Non-toxic appearance.  HENT:  Head: Normocephalic.  Right Ear: Tympanic membrane and external ear normal.  Left Ear: Tympanic membrane and external ear normal.  Eyes: EOM and lids are normal. Pupils are equal, round, and reactive to light.  Neck: Normal range of motion. Neck supple. Carotid bruit is not present.  Cardiovascular: Normal rate, regular rhythm, normal heart sounds, intact distal pulses and normal pulses.   Pulmonary/Chest: Breath sounds normal. No respiratory distress.  Abdominal: Soft. Bowel sounds are normal. There is no tenderness. There is no guarding.  Musculoskeletal: Normal range of motion.       There is full range  of motion of the left  hip. There is good range of motion of the left  knee but with some pain. The patella is in the midline. There no posterior mass is appreciated. There's no deformity of the quadricep area nor the anterior tibial tuberosity. There is no deformity of the tib-fib area. There is full range of motion of the left  ankle. The Achilles tendon is intact. The capillary refill on the left  is less than 3 seconds. Dorsalis pedis and posterior tibial pulses are 2+.  Lymphadenopathy:       Head (right side): No submandibular adenopathy  present.       Head (left side): No submandibular adenopathy present.    She has no cervical adenopathy.  Neurological: She is alert and oriented to person, place, and time. She has normal strength. No cranial nerve deficit or sensory deficit.  Skin: Skin is warm and dry.  Psychiatric: She has a normal mood and affect. Her speech is normal.    ED Course  Procedures (including critical care time)  Labs Reviewed - No data to display Dg Knee Complete 4 Views Left  04/29/2012  *RADIOLOGY REPORT*  Clinical Data: Knee pain  LEFT KNEE - COMPLETE 4+ VIEW  Comparison: None.  Findings: Four views of the left knee submitted.  No acute fracture or subluxation.  No radiopaque foreign body.  No joint effusion.  IMPRESSION: No acute fracture or subluxation.   Original Report Authenticated By: Natasha Mead, M.D.      No diagnosis found.    MDM  I have reviewed nursing notes, vital signs, and all appropriate lab and imaging results for this patient. The x-ray of the left knee is negative for fracture or subluxation. The examination is negative for any acute changes or problems. The patient is advised to use ice pack, elevation, and diclofenac 2 times daily with food. Knee immobilizer was offered to the patient she declines at this time.       Kathie Dike, PA 04/29/12 1853  Kathie Dike, PA 06/04/12 Rickey Primus

## 2012-04-29 NOTE — ED Notes (Signed)
Pt c/o left knee pain after fall a couple of days ago.

## 2012-04-29 NOTE — ED Notes (Signed)
No answer when called in waiting room. 

## 2012-05-01 NOTE — ED Provider Notes (Signed)
Medical screening examination/treatment/procedure(s) were performed by non-physician practitioner and as supervising physician I was immediately available for consultation/collaboration.  Flint Melter, MD 05/01/12 1213

## 2012-05-05 ENCOUNTER — Emergency Department (HOSPITAL_COMMUNITY)
Admission: EM | Admit: 2012-05-05 | Discharge: 2012-05-05 | Disposition: A | Discharge status: Home / Self Care | Payer: Medicaid Other | Attending: Emergency Medicine | Admitting: Emergency Medicine

## 2012-05-05 ENCOUNTER — Encounter (HOSPITAL_COMMUNITY): Payer: Self-pay | Admitting: *Deleted

## 2012-05-05 DIAGNOSIS — M545 Low back pain, unspecified: Secondary | ICD-10-CM | POA: Insufficient documentation

## 2012-05-05 DIAGNOSIS — Z880 Allergy status to penicillin: Secondary | ICD-10-CM | POA: Insufficient documentation

## 2012-05-05 DIAGNOSIS — Z91040 Latex allergy status: Secondary | ICD-10-CM | POA: Insufficient documentation

## 2012-05-05 DIAGNOSIS — F17200 Nicotine dependence, unspecified, uncomplicated: Secondary | ICD-10-CM | POA: Insufficient documentation

## 2012-05-05 DIAGNOSIS — Z885 Allergy status to narcotic agent status: Secondary | ICD-10-CM | POA: Insufficient documentation

## 2012-05-05 DIAGNOSIS — M5431 Sciatica, right side: Secondary | ICD-10-CM

## 2012-05-05 DIAGNOSIS — M5430 Sciatica, unspecified side: Secondary | ICD-10-CM | POA: Insufficient documentation

## 2012-05-05 LAB — URINALYSIS, ROUTINE W REFLEX MICROSCOPIC
Bilirubin Urine: NEGATIVE
Glucose, UA: NEGATIVE mg/dL
Ketones, ur: NEGATIVE mg/dL
Leukocytes, UA: NEGATIVE
Nitrite: NEGATIVE
Specific Gravity, Urine: 1.02 (ref 1.005–1.030)
pH: 6.5 (ref 5.0–8.0)

## 2012-05-05 MED ORDER — ORPHENADRINE CITRATE ER 100 MG PO TB12: 100.0000 mg | ORAL_TABLET | Freq: Two times a day (BID) | ORAL | Status: DC

## 2012-05-05 MED ORDER — OXYCODONE-ACETAMINOPHEN 5-325 MG PO TABS: 1.0000 | ORAL_TABLET | ORAL | Status: AC | PRN

## 2012-05-05 MED ORDER — NAPROXEN 500 MG PO TABS: 500.0000 mg | ORAL_TABLET | Freq: Two times a day (BID) | ORAL | Status: DC

## 2012-05-05 NOTE — ED Notes (Signed)
Patient verbalizes understanding of discharge instructions, follow up care and rx x3.

## 2012-05-05 NOTE — ED Notes (Signed)
Pinched sciatica nerve x 2 weeks per pt.

## 2012-05-05 NOTE — ED Provider Notes (Signed)
History  This chart was scribed for Autumn Booze, MD by Shari Heritage. The patient was seen in room APA03/APA03. Patient's care was started at 0959.     CSN: [id number]  Arrival date & time 05/05/12  1478   First MD Initiated Contact with Patient 05/05/12 [phone]      Chief Complaint  Patient presents with  . Back Pain     The history is provided by the patient. No language interpreter was used.     HPI Comments: Autumn Herrera is a 26 y.o. female with a history of lumbar degenerative disc disease who presents to the Emergency Department complaining of moderate, constant right lumbar back pain that radiates down her right leg. There is associated numbness in her right leg. Patient rates back pain as 10/10. She states that she originally injured her back after falling 6 ft from a ladder. She says that sitting and walking worsens the pain. She has been taking Ibuprofen for pain with no relief. She was seen for the same here on 9/24 and 9/26. Patient says that she has an appointment with Dr. Hilda Lias in 2 weeks. Patient reports no other significant past medical or surgical history. She is a current smoker.    Past Medical History  Diagnosis Date  . Degenerative disc disease, lumbar   . Disc herniation     Past Surgical History  Procedure Date  . Knee surgery   . Cesarean section     No family history on file.  History  Substance Use Topics  . Smoking status: Current Every Day Smoker  . Smokeless tobacco: Not on file  . Alcohol Use: No    OB History    Grav Para Term Preterm Abortions TAB SAB Ect Mult Living                  Review of Systems  Musculoskeletal: Positive for back pain.  Neurological: Positive for numbness.  All other systems reviewed and are negative.    Allergies  Hydrocodone; Latex; and Penicillins  Home Medications   Current Outpatient Rx  Name Route Sig Dispense Refill  . DICLOFENAC SODIUM 75 MG PO TBEC Oral Take 1 tablet (75 mg total) by  mouth 2 (two) times daily. 12 tablet 0  . IBUPROFEN 200 MG PO TABS Oral Take 800 mg by mouth every 6 (six) hours as needed. Pain    . OXYCODONE-ACETAMINOPHEN 5-325 MG PO TABS  One tab po q 6 hrs prn pain 20 tablet 0  . PREDNISONE 50 MG PO TABS Oral Take 1 tablet (50 mg total) by mouth daily. 6 tablet 0    BP 100/70  Pulse 109  Temp 98.1 F (36.7 C) (Oral)  Resp 18  Ht 5\' 4"  (1.626 m)  Wt 117 lb (53.071 kg)  BMI 20.08 kg/m2  SpO2 100%  LMP 04/12/2012  Physical Exam  Nursing note and vitals reviewed. Constitutional: She is oriented to person, place, and time. She appears well-developed and well-nourished.  HENT:  Head: Normocephalic and atraumatic.  Neck: Normal range of motion. Neck supple.  Musculoskeletal: Normal range of motion.       Mild tenderness of lumbar and right paralumbar areas. Mild muscle spasm to right paralumbar area.  Positive straight leg raise at 45 degrees on the right.  Neurological: She is alert and oriented to person, place, and time.       No pinprick sensation entire right leg.  Skin: Skin is warm and dry.  Psychiatric: She  has a normal mood and affect. Her behavior is normal.    ED Course  Procedures (including critical care time)  DIAGNOSTIC STUDIES: Oxygen Saturation is 100% on room air, normal by my interpretation.    COORDINATION OF CARE: 10:14am- Patient informed of current plan for treatment and evaluation and agrees with plan at this time.    1. Lumbar pain   2. Sciatica of right side       MDM  Back pain which appears to be musculoskeletal. Component of sciatica is noted. Numbness of the right leg does not follow any dermatome pattern and I do not believe is significant-especially with complete lack of any motor deficits. She is sent home with prescriptions for Percocet, Naprosyn, and orphenadrine. She is to followup with her PCP to get a referral to orthopedics.      I personally performed the services described in this  documentation, which was scribed in my presence. The recorded information has been reviewed and considered.      Autumn Booze, MD 05/05/12 1038

## 2012-05-24 ENCOUNTER — Other Ambulatory Visit (HOSPITAL_COMMUNITY)
Admission: RE | Admit: 2012-05-24 | Discharge: 2012-05-24 | Disposition: A | Discharge status: Home / Self Care | Payer: Medicaid Other | Source: Ambulatory Visit | Attending: Obstetrics and Gynecology | Admitting: Obstetrics and Gynecology

## 2012-05-24 ENCOUNTER — Other Ambulatory Visit: Payer: Self-pay | Admitting: Women's Health

## 2012-05-24 DIAGNOSIS — Z113 Encounter for screening for infections with a predominantly sexual mode of transmission: Secondary | ICD-10-CM | POA: Insufficient documentation

## 2012-05-24 DIAGNOSIS — Z01419 Encounter for gynecological examination (general) (routine) without abnormal findings: Secondary | ICD-10-CM | POA: Insufficient documentation

## 2012-05-24 LAB — OB RESULTS CONSOLE HIV ANTIBODY (ROUTINE TESTING): HIV: NONREACTIVE

## 2012-05-24 LAB — OB RESULTS CONSOLE ABO/RH: RH Type: POSITIVE

## 2012-05-24 LAB — OB RESULTS CONSOLE VARICELLA ZOSTER ANTIBODY, IGG: Varicella: IMMUNE

## 2012-05-24 LAB — OB RESULTS CONSOLE HEPATITIS B SURFACE ANTIGEN: Hepatitis B Surface Ag: NEGATIVE

## 2012-05-24 LAB — OB RESULTS CONSOLE RUBELLA ANTIBODY, IGM: Rubella: IMMUNE

## 2012-06-04 NOTE — ED Provider Notes (Signed)
Medical screening examination/treatment/procedure(s) were conducted as a shared visit with non-physician practitioner(s) and myself.  I personally evaluated the patient during the encounter  Flint Melter, MD 06/04/12 2117

## 2012-06-05 ENCOUNTER — Encounter (HOSPITAL_COMMUNITY): Payer: Self-pay | Admitting: *Deleted

## 2012-06-05 ENCOUNTER — Emergency Department (HOSPITAL_COMMUNITY)
Admission: EM | Admit: 2012-06-05 | Discharge: 2012-06-05 | Disposition: A | Discharge status: Home / Self Care | Payer: Medicaid Other | Attending: Emergency Medicine | Admitting: Emergency Medicine

## 2012-06-05 ENCOUNTER — Emergency Department (HOSPITAL_COMMUNITY): Payer: Medicaid Other

## 2012-06-05 DIAGNOSIS — A499 Bacterial infection, unspecified: Secondary | ICD-10-CM | POA: Insufficient documentation

## 2012-06-05 DIAGNOSIS — N760 Acute vaginitis: Secondary | ICD-10-CM

## 2012-06-05 DIAGNOSIS — R102 Pelvic and perineal pain unspecified side: Secondary | ICD-10-CM

## 2012-06-05 DIAGNOSIS — Z79899 Other long term (current) drug therapy: Secondary | ICD-10-CM | POA: Insufficient documentation

## 2012-06-05 DIAGNOSIS — B9689 Other specified bacterial agents as the cause of diseases classified elsewhere: Secondary | ICD-10-CM | POA: Insufficient documentation

## 2012-06-05 DIAGNOSIS — F17200 Nicotine dependence, unspecified, uncomplicated: Secondary | ICD-10-CM | POA: Insufficient documentation

## 2012-06-05 DIAGNOSIS — IMO0002 Reserved for concepts with insufficient information to code with codable children: Secondary | ICD-10-CM | POA: Insufficient documentation

## 2012-06-05 DIAGNOSIS — Z3490 Encounter for supervision of normal pregnancy, unspecified, unspecified trimester: Secondary | ICD-10-CM

## 2012-06-05 DIAGNOSIS — O2390 Unspecified genitourinary tract infection in pregnancy, unspecified trimester: Secondary | ICD-10-CM | POA: Insufficient documentation

## 2012-06-05 DIAGNOSIS — R109 Unspecified abdominal pain: Secondary | ICD-10-CM | POA: Insufficient documentation

## 2012-06-05 DIAGNOSIS — N949 Unspecified condition associated with female genital organs and menstrual cycle: Secondary | ICD-10-CM | POA: Insufficient documentation

## 2012-06-05 DIAGNOSIS — O219 Vomiting of pregnancy, unspecified: Secondary | ICD-10-CM

## 2012-06-05 LAB — CBC WITH DIFFERENTIAL/PLATELET
Eosinophils Absolute: 0.1 10*3/uL (ref 0.0–0.7)
HCT: 40.2 % (ref 36.0–46.0)
Hemoglobin: 14.3 g/dL (ref 12.0–15.0)
Lymphs Abs: 2 10*3/uL (ref 0.7–4.0)
MCH: 30.9 pg (ref 26.0–34.0)
Monocytes Absolute: 0.6 10*3/uL (ref 0.1–1.0)
Monocytes Relative: 5 % (ref 3–12)
Neutrophils Relative %: 77 % (ref 43–77)
RBC: 4.63 MIL/uL (ref 3.87–5.11)

## 2012-06-05 LAB — WET PREP, GENITAL
Trich, Wet Prep: NONE SEEN
Yeast Wet Prep HPF POC: NONE SEEN

## 2012-06-05 LAB — URINALYSIS, ROUTINE W REFLEX MICROSCOPIC
Bilirubin Urine: NEGATIVE
Nitrite: NEGATIVE
Specific Gravity, Urine: 1.02 (ref 1.005–1.030)
Urobilinogen, UA: 0.2 mg/dL (ref 0.0–1.0)
pH: 6 (ref 5.0–8.0)

## 2012-06-05 LAB — HCG, QUANTITATIVE, PREGNANCY: hCG, Beta Chain, Quant, S: 50393 m[IU]/mL — ABNORMAL HIGH (ref <5)

## 2012-06-05 LAB — COMPREHENSIVE METABOLIC PANEL
ALT: 9 U/L (ref 0–35)
AST: 13 U/L (ref 0–37)
Albumin: 4.4 g/dL (ref 3.5–5.2)
Calcium: 10.3 mg/dL (ref 8.4–10.5)
Creatinine, Ser: 0.66 mg/dL (ref 0.50–1.10)
GFR calc non Af Amer: >90 mL/min (ref >90)
Sodium: 135 mEq/L (ref 135–145)
Total Protein: 7.5 g/dL (ref 6.0–8.3)

## 2012-06-05 LAB — ABO/RH: ABO/RH(D): A POS

## 2012-06-05 MED ORDER — ONDANSETRON 8 MG PO TBDP
8.0000 mg | ORAL_TABLET | Freq: Once | ORAL | Status: AC
  Administered 2012-06-05: 8 mg via ORAL
  Filled 2012-06-05: qty 1

## 2012-06-05 MED ORDER — METRONIDAZOLE 500 MG PO TABS: 500.0000 mg | ORAL_TABLET | Freq: Two times a day (BID) | ORAL | Status: DC

## 2012-06-05 MED ORDER — ONDANSETRON HCL 4 MG PO TABS: 4.0000 mg | ORAL_TABLET | Freq: Three times a day (TID) | ORAL | Status: DC | PRN

## 2012-06-05 NOTE — ED Notes (Addendum)
Pt c/o left side lower abd pain that started two days ago, n/v for the past 4 days, pt is [redacted] weeks pregnant, this is pt's third pregnancy, EDD 01/17/2013, pt denies any vaginal bleeding or discharge, has had ultrasound in Dr. Rayna Sexton to confirm pregnancy

## 2012-06-05 NOTE — ED Notes (Signed)
Patient tolerated drink well. EDP made aware.

## 2012-06-05 NOTE — ED Provider Notes (Signed)
History     CSN: [id number]  Arrival date & time 06/05/12  1131   First MD Initiated Contact with Patient 06/05/12 1317      Chief Complaint  Patient presents with  . Abdominal Pain  . Nausea  . Emesis    HPI Pt was seen at 1325.  Per pt, c/o gradual onset and persistence of multiple intermittent episodes of N/V for the past 4 days.  Has been associated with constant left lower pelvic pain for the past 2 days.  Pt describes the pain as "sharp."  Denies dysuria/hematuria, no back pain, no vaginal bleeding/discharge, no fevers, no diarrhea, no black or blood in stools or emesis.  Hx G3P2, LMP approx 04/09/12, with EGA 8 and 1/7 weeks.     OB/GYN: Dr. Emelda Fear Past Medical History  Diagnosis Date  . Degenerative disc disease, lumbar   . Disc herniation   . Pregnant     Past Surgical History  Procedure Date  . Knee surgery   . Cesarean section     History  Substance Use Topics  . Smoking status: Current Every Day Smoker  . Smokeless tobacco: Not on file  . Alcohol Use: No     Review of Systems ROS: Statement: All systems negative except as marked or noted in the HPI; Constitutional: Negative for fever and chills. ; ; Eyes: Negative for eye pain, redness and discharge. ; ; ENMT: Negative for ear pain, hoarseness, nasal congestion, sinus pressure and sore throat. ; ; Cardiovascular: Negative for chest pain, palpitations, diaphoresis, dyspnea and peripheral edema. ; ; Respiratory: Negative for cough, wheezing and stridor. ; ; Gastrointestinal: +N/V. Negative for diarrhea, abdominal pain, blood in stool, hematemesis, jaundice and rectal bleeding. . ; ; Genitourinary: Negative for dysuria, flank pain and hematuria. ; ; GYN:  +pelvic pain. No vaginal bleeding, no vaginal discharge, no vulvar pain.;; Musculoskeletal: Negative for back pain and neck pain. Negative for swelling and trauma.; ; Skin: Negative for pruritus, rash, abrasions, blisters, bruising and skin lesion.; ; Neuro:  Negative for headache, lightheadedness and neck stiffness. Negative for weakness, altered level of consciousness , altered mental status, extremity weakness, paresthesias, involuntary movement, seizure and syncope.       Allergies  Penicillins; Hydrocodone; and Latex  Home Medications   Current Outpatient Rx  Name Route Sig Dispense Refill  . PRENATAL MULTIVITAMIN CH Oral Take 1 tablet by mouth every morning.      BP 106/75  Pulse 103  Temp 98.2 F (36.8 C)  Resp 18  SpO2 100%  LMP 04/09/2012  Physical Exam 1330: Physical examination:  Nursing notes reviewed; Vital signs and O2 SAT reviewed;  Constitutional: Well developed, Well nourished, Well hydrated, In no acute distress; Head:  Normocephalic, atraumatic; Eyes: EOMI, PERRL, No scleral icterus; ENMT: Mouth and pharynx normal, Mucous membranes moist; Neck: Supple, Full range of motion, No lymphadenopathy; Cardiovascular: Regular rate and rhythm, No murmur, rub, or gallop; Respiratory: Breath sounds clear & equal bilaterally, No rales, rhonchi, wheezes.  Speaking full sentences with ease, Normal respiratory effort/excursion; Chest: Nontender, Movement normal; Abdomen: Soft, +mild LLQ tenderness to palp. No rebound or guarding. Nondistended, Normal bowel sounds; Genitourinary: No CVA tenderness; Pelvic exam performed with permission of pt and female ED tech assist during exam.  External genitalia w/o lesions. Vaginal vault with thick white discharge.  Cervix w/o lesions, not friable, GC/chlam and wet prep obtained and sent to lab.  Bimanual exam w/o CMT, uterine or right adnexal tenderness, +left adnexal tenderness.;;  Extremities: Pulses normal, No tenderness, No edema, No calf edema or asymmetry.; Neuro: AA&Ox3, Major CN grossly intact.  Speech clear. No gross focal motor or sensory deficits in extremities.; Skin: Color normal, Warm, Dry.   ED Course  Procedures    MDM  MDM Reviewed: nursing note and vitals Interpretation: labs  and ultrasound     Results for orders placed during the hospital encounter of 06/05/12  URINALYSIS, ROUTINE W REFLEX MICROSCOPIC      Component Value Range   Color, Urine YELLOW  YELLOW   APPearance CLEAR  CLEAR   Specific Gravity, Urine 1.020  1.005 - 1.030   pH 6.0  5.0 - 8.0   Glucose, UA NEGATIVE  NEGATIVE mg/dL   Hgb urine dipstick NEGATIVE  NEGATIVE   Bilirubin Urine NEGATIVE  NEGATIVE   Ketones, ur NEGATIVE  NEGATIVE mg/dL   Protein, ur NEGATIVE  NEGATIVE mg/dL   Urobilinogen, UA 0.2  0.0 - 1.0 mg/dL   Nitrite NEGATIVE  NEGATIVE   Leukocytes, UA NEGATIVE  NEGATIVE  PREGNANCY, URINE      Component Value Range   Preg Test, Ur POSITIVE (*) NEGATIVE  WET PREP, GENITAL      Component Value Range   Yeast Wet Prep HPF POC NONE SEEN  NONE SEEN   Trich, Wet Prep NONE SEEN  NONE SEEN   Clue Cells Wet Prep HPF POC FEW (*) NONE SEEN   WBC, Wet Prep HPF POC FEW (*) NONE SEEN  COMPREHENSIVE METABOLIC PANEL      Component Value Range   Sodium 135  135 - 145 mEq/L   Potassium 3.7  3.5 - 5.1 mEq/L   Chloride 100  96 - 112 mEq/L   CO2 24  19 - 32 mEq/L   Glucose, Bld 80  70 - 99 mg/dL   BUN 10  6 - 23 mg/dL   Creatinine, Ser 1.19  0.50 - 1.10 mg/dL   Calcium 14.7  8.4 - 82.9 mg/dL   Total Protein 7.5  6.0 - 8.3 g/dL   Albumin 4.4  3.5 - 5.2 g/dL   AST 13  0 - 37 U/L   ALT 9  0 - 35 U/L   Alkaline Phosphatase 55  39 - 117 U/L   Total Bilirubin 0.9  0.3 - 1.2 mg/dL   GFR calc non Af Amer >90  >90 mL/min   GFR calc Af Amer >90  >90 mL/min  CBC WITH DIFFERENTIAL      Component Value Range   WBC 11.9 (*) 4.0 - 10.5 K/uL   RBC 4.63  3.87 - 5.11 MIL/uL   Hemoglobin 14.3  12.0 - 15.0 g/dL   HCT 56.2  13.0 - 86.5 %   MCV 86.8  78.0 - 100.0 fL   MCH 30.9  26.0 - 34.0 pg   MCHC 35.6  30.0 - 36.0 g/dL   RDW 78.4  69.6 - 29.5 %   Platelets 212  150 - 400 K/uL   Neutrophils Relative 77  43 - 77 %   Neutro Abs 9.1 (*) 1.7 - 7.7 K/uL   Lymphocytes Relative 17  12 - 46 %   Lymphs Abs  2.0  0.7 - 4.0 K/uL   Monocytes Relative 5  3 - 12 %   Monocytes Absolute 0.6  0.1 - 1.0 K/uL   Eosinophils Relative 1  0 - 5 %   Eosinophils Absolute 0.1  0.0 - 0.7 K/uL   Basophils Relative 0  0 -  1 %   Basophils Absolute 0.0  0.0 - 0.1 K/uL  LIPASE, BLOOD      Component Value Range   Lipase 27  11 - 59 U/L  ABO/RH      Component Value Range   ABO/RH(D) A POS    HCG, QUANTITATIVE, PREGNANCY      Component Value Range   hCG, Beta Francene Finders [address] (*) <5 mIU/mL   US Ob Comp Less 14 Wks 06/05/2012  *RADIOLOGY REPORT*  Clinical Data: Left sided pelvic pain.  Pregnant patient.  Evaluate for torsion or ectopic pregnancy.  OBSTETRIC <14 WK [place] AND TRANSVAGINAL OB US  Technique:  Both transabdominal and transvaginal ultrasound examinations were performed for complete evaluation of the gestation as well as the maternal uterus, adnexal regions, and pelvic cul-de-sac.  Transvaginal technique was performed to assess early pregnancy.  Comparison:  No priors.  Intrauterine gestational sac:  Single gestational sac is normal and ovoid in shape. Yolk sac: Present Embryo: Single embryo. Cardiac Activity: Identified. Heart Rate: 168 bpm  CRL: 18  mm  8 w  2 d         [place] EDC: 01/13/2013  Maternal uterus/adnexae: Small subchorionic hemorrhage.  The ovaries are normal in echotexture appearance bilaterally with numerous small follicles. Normal low resistance blood flow to the ovaries bilaterally.  Trace free fluid the cul-de-sac.  No focal cystic or solid adnexal masses.  IMPRESSION: 1.  Single viable IUP with a crown rump length of 18 mm corresponding to an estimated gestational age of [redacted] weeks and 2 days.  Normal fetal cardiac activity (heart rate of 168 beats per minute). 2.  Small subchorionic hemorrhage. 3.  No evidence of heterotopic ectopic pregnancy.  No findings to suggest ovarian torsion.   Original Report Authenticated By: Trudie Reed, M.D.      1600:  Pt has tol PO well while in the ED without  N/V.  Wants to go home now.  Will tx for BV while GC/chlam pending.  Dx and testing d/w pt and family.  Questions answered.  Verb understanding, agreeable to d/c home with outpt f/u.         Laray Anger, DO 06/07/12 1245

## 2012-06-06 LAB — URINE CULTURE

## 2012-06-07 LAB — GC/CHLAMYDIA PROBE AMP, GENITAL: Chlamydia, DNA Probe: NEGATIVE

## 2012-06-15 ENCOUNTER — Emergency Department (HOSPITAL_COMMUNITY)
Admission: EM | Admit: 2012-06-15 | Discharge: 2012-06-16 | Disposition: A | Discharge status: Home / Self Care | Payer: Medicaid Other | Attending: Emergency Medicine | Admitting: Emergency Medicine

## 2012-06-15 ENCOUNTER — Encounter (HOSPITAL_COMMUNITY): Payer: Self-pay | Admitting: *Deleted

## 2012-06-15 DIAGNOSIS — O209 Hemorrhage in early pregnancy, unspecified: Secondary | ICD-10-CM

## 2012-06-15 DIAGNOSIS — O4690 Antepartum hemorrhage, unspecified, unspecified trimester: Secondary | ICD-10-CM | POA: Insufficient documentation

## 2012-06-15 DIAGNOSIS — Z79899 Other long term (current) drug therapy: Secondary | ICD-10-CM | POA: Insufficient documentation

## 2012-06-15 DIAGNOSIS — F17200 Nicotine dependence, unspecified, uncomplicated: Secondary | ICD-10-CM | POA: Insufficient documentation

## 2012-06-15 NOTE — ED Notes (Signed)
Pt reports passing a blood clot about 30 minutes ago & having some like bleeding at this time. Pt states this is her 3rd pregnancy & never happened before.

## 2012-06-16 LAB — HCG, QUANTITATIVE, PREGNANCY: hCG, Beta Chain, Quant, S: 57138 m[IU]/mL — ABNORMAL HIGH (ref <5)

## 2012-06-16 NOTE — ED Provider Notes (Signed)
History     CSN: [id number]  Arrival date & time 06/15/12  2317   First MD Initiated Contact with Patient 06/15/12 2333      Chief Complaint  Patient presents with  . Vaginal Bleeding    (Consider location/radiation/quality/duration/timing/severity/associated sxs/prior treatment) HPI  Autumn Herrera is a 26 y.o. female, G3, P2, [redacted] weeks pregnant, EDC 01/17/13, LMP 9,5,13  who presents to the Emergency Department complaining of vaginal bleeding that began this evening. She was at work feeling pressure to urinate. When she went to the bathroom she passed a large blood clot vaginally and has been spotting dark blood since. Has mild lower abdominal discomfort. Denies fever, chills, nausea, vomiting. Last two pregnancies were uneventful and deliveries full time.   OB/GYN Dr. Emelda Fear     Past Medical History  Diagnosis Date  . Degenerative disc disease, lumbar   . Disc herniation   . Pregnant     Past Surgical History  Procedure Date  . Knee surgery   . Cesarean section     No family history on file.  History  Substance Use Topics  . Smoking status: Current Every Day Smoker  . Smokeless tobacco: Not on file  . Alcohol Use: No    OB History    Grav Para Term Preterm Abortions TAB SAB Ect Mult Living   1               Review of Systems  Constitutional: Negative for fever.       10 Systems reviewed and are negative for acute change except as noted in the HPI.  HENT: Negative for congestion.   Eyes: Negative for discharge and redness.  Respiratory: Negative for cough and shortness of breath.   Cardiovascular: Negative for chest pain.  Gastrointestinal: Negative for vomiting and abdominal pain.  Genitourinary: Positive for vaginal bleeding and pelvic pain.  Musculoskeletal: Negative for back pain.  Skin: Negative for rash.  Neurological: Negative for syncope, numbness and headaches.  Psychiatric/Behavioral:       No behavior change.    Allergies    Penicillins; Hydrocodone; and Latex  Home Medications   Current Outpatient Rx  Name  Route  Sig  Dispense  Refill  . PRENATAL MULTIVITAMIN CH   Oral   Take 1 tablet by mouth every morning.         Marland Kitchen METRONIDAZOLE 500 MG PO TABS   Oral   Take 1 tablet (500 mg total) by mouth 2 (two) times daily.   14 tablet   0   . ONDANSETRON HCL 4 MG PO TABS   Oral   Take 1 tablet (4 mg total) by mouth every 8 (eight) hours as needed for nausea.   6 tablet   0     LMP 06/15/2012  Physical Exam  Nursing note and vitals reviewed. Constitutional: She appears well-developed and well-nourished.       Awake, alert, nontoxic appearance.  HENT:  Head: Normocephalic and atraumatic.  Eyes: Right eye exhibits no discharge. Left eye exhibits no discharge.  Neck: Neck supple.  Cardiovascular: Normal heart sounds.   Pulmonary/Chest: Effort normal. She exhibits no tenderness.  Abdominal: Soft. There is no tenderness. There is no rebound.  Genitourinary:       Pelvic exam performed with permission of pt and female ED tech assist during exam.  External genitalia w/o lesions. Vaginal vault without discharge.  Cervix w/o lesions, not friable, with dark brownish blood in the vaginal vault.  Bimanual exam  w/o CMT, uterine or adenexal tenderness.   Musculoskeletal: She exhibits no tenderness.       Baseline ROM, no obvious new focal weakness.  Neurological:       Mental status and motor strength appears baseline for patient and situation.  Skin: No rash noted.  Psychiatric: She has a normal mood and affect.    ED Course  Procedures (including critical care time)  Results for orders placed during the hospital encounter of 06/15/12  HCG, QUANTITATIVE, PREGNANCY      Component Value Range   hCG, Beta Chain, Quant, S [address]109 (*) <5 mIU/mL      MDM  Patient is G3,P2, [redacted] weeks pregnant presents with vaginal bleeding. PE with dark blood in vaginal vault, cervix closed. HCG 57,128. Patient to have  value repeated in 48 hours. Reviewed results with patient. She will follow up with OB/GYN.Pt stable in ED with no significant deterioration in condition.The patient appears reasonably screened and/or stabilized for discharge and I doubt any other medical condition or other [facility] requiring further screening, evaluation, or treatment in the ED at this time prior to discharge.  MDM Reviewed: nursing note and vitals Interpretation: labs           Nicoletta Dress. Colon Branch, MD 06/16/12 6045

## 2012-07-07 ENCOUNTER — Encounter (HOSPITAL_COMMUNITY): Payer: Self-pay | Admitting: Emergency Medicine

## 2012-07-07 ENCOUNTER — Emergency Department (HOSPITAL_COMMUNITY)
Admission: EM | Admit: 2012-07-07 | Discharge: 2012-07-07 | Disposition: A | Discharge status: Home / Self Care | Payer: Medicaid Other | Attending: Emergency Medicine | Admitting: Emergency Medicine

## 2012-07-07 DIAGNOSIS — O219 Vomiting of pregnancy, unspecified: Secondary | ICD-10-CM | POA: Insufficient documentation

## 2012-07-07 DIAGNOSIS — E860 Dehydration: Secondary | ICD-10-CM

## 2012-07-07 DIAGNOSIS — F17200 Nicotine dependence, unspecified, uncomplicated: Secondary | ICD-10-CM | POA: Insufficient documentation

## 2012-07-07 DIAGNOSIS — Z79899 Other long term (current) drug therapy: Secondary | ICD-10-CM | POA: Insufficient documentation

## 2012-07-07 DIAGNOSIS — M5106 Intervertebral disc disorders with myelopathy, lumbar region: Secondary | ICD-10-CM | POA: Insufficient documentation

## 2012-07-07 DIAGNOSIS — R55 Syncope and collapse: Secondary | ICD-10-CM | POA: Insufficient documentation

## 2012-07-07 LAB — URINALYSIS, ROUTINE W REFLEX MICROSCOPIC
Leukocytes, UA: NEGATIVE
Nitrite: NEGATIVE
Protein, ur: 30 mg/dL — AB
Specific Gravity, Urine: >1.03 — ABNORMAL HIGH (ref 1.005–1.030)
Urobilinogen, UA: 0.2 mg/dL (ref 0.0–1.0)

## 2012-07-07 LAB — BASIC METABOLIC PANEL
Calcium: 9 mg/dL (ref 8.4–10.5)
GFR calc Af Amer: >90 mL/min (ref >90)
GFR calc non Af Amer: >90 mL/min (ref >90)
Glucose, Bld: 112 mg/dL — ABNORMAL HIGH (ref 70–99)
Potassium: 3.3 mEq/L — ABNORMAL LOW (ref 3.5–5.1)
Sodium: 135 mEq/L (ref 135–145)

## 2012-07-07 LAB — URINE MICROSCOPIC-ADD ON

## 2012-07-07 MED ORDER — SODIUM CHLORIDE 0.9 % IV SOLN
1000.0000 mL | Freq: Once | INTRAVENOUS | Status: AC
  Administered 2012-07-07: 1000 mL via INTRAVENOUS

## 2012-07-07 MED ORDER — POTASSIUM CHLORIDE 20 MEQ/15ML (10%) PO LIQD
ORAL | Status: AC
  Filled 2012-07-07: qty 30

## 2012-07-07 MED ORDER — POTASSIUM CHLORIDE CRYS ER 20 MEQ PO TBCR
40.0000 meq | EXTENDED_RELEASE_TABLET | Freq: Once | ORAL | Status: AC
  Administered 2012-07-07: 40 meq via ORAL
  Filled 2012-07-07: qty 2

## 2012-07-07 MED ORDER — ONDANSETRON HCL 4 MG/2ML IJ SOLN
4.0000 mg | Freq: Once | INTRAMUSCULAR | Status: AC
  Administered 2012-07-07: 4 mg via INTRAVENOUS
  Filled 2012-07-07: qty 2

## 2012-07-07 MED ORDER — SODIUM CHLORIDE 0.9 % IV SOLN
1000.0000 mL | INTRAVENOUS | Status: DC
  Administered 2012-07-07: 1000 mL via INTRAVENOUS

## 2012-07-07 NOTE — ED Provider Notes (Signed)
History     CSN: [id number] Arrival date & time 07/07/12  0152 First MD Initiated Contact with Patient 07/07/12 0202      Chief Complaint  Patient presents with  . Emesis During Pregnancy  . Loss of Consciousness    HPI Pt is g3p2, [redacted] weeks EGA.  She has been having trouble with nausea and vomiting today since about 5pm.  She has been vomiting every time she tries to eat.  No diarrhea.  No abdominal pain.  Her symptoms persisted while she was at work today, vomiting every 30 minutes and she fainted.  A co worker was near her and caught her before she fell. She has had some spotting during this pregnancy but no bleeding today.    Earlier in the day she hadd seen her OB and everything was fine. Past Medical History  Diagnosis Date  . Degenerative disc disease, lumbar   . Disc herniation   . Pregnant     Past Surgical History  Procedure Date  . Knee surgery   . Cesarean section     No family history on file.  History  Substance Use Topics  . Smoking status: Current Every Day Smoker  . Smokeless tobacco: Not on file  . Alcohol Use: No    OB History    Grav Para Term Preterm Abortions TAB SAB Ect Mult Living   1               Review of Systems  All other systems reviewed and are negative.    Allergies  Penicillins; Hydrocodone; and Latex  Home Medications   Current Outpatient Rx  Name  Route  Sig  Dispense  Refill  . PRENATAL MULTIVITAMIN CH   Oral   Take 1 tablet by mouth every morning.         Marland Kitchen METRONIDAZOLE 500 MG PO TABS   Oral   Take 1 tablet (500 mg total) by mouth 2 (two) times daily.   14 tablet   0   . ONDANSETRON HCL 4 MG PO TABS   Oral   Take 1 tablet (4 mg total) by mouth every 8 (eight) hours as needed for nausea.   6 tablet   0     BP 119/78  Pulse 106  Temp 97.8 F (36.6 C) (Oral)  Resp 20  Ht 5\' 4"  (1.626 m)  Wt 119 lb (53.978 kg)  BMI 20.43 kg/m2  SpO2 100%  LMP 06/15/2012  Physical Exam  Nursing note and vitals  reviewed. Constitutional: She appears well-developed and well-nourished. No distress.  HENT:  Head: Normocephalic and atraumatic.  Right Ear: External ear normal.  Left Ear: External ear normal.  Eyes: Conjunctivae normal are normal. Right eye exhibits no discharge. Left eye exhibits no discharge. No scleral icterus.  Neck: Neck supple. No tracheal deviation present.  Cardiovascular: Normal rate, regular rhythm and intact distal pulses.   Pulmonary/Chest: Effort normal and breath sounds normal. No stridor. No respiratory distress. She has no wheezes. She has no rales.  Abdominal: Soft. Bowel sounds are normal. She exhibits no distension. There is no tenderness. There is no rebound and no guarding.  Musculoskeletal: She exhibits no edema and no tenderness.  Neurological: She is alert. She has normal strength. No sensory deficit. Cranial nerve deficit:  no gross defecits noted. She exhibits normal muscle tone. She displays no seizure activity. Coordination normal.  Skin: Skin is warm and dry. No rash noted.  Psychiatric: She has a normal mood  and affect.    ED Course  Procedures (including critical care time)  Rate: 94  Rhythm: normal sinus rhythm  QRS Axis: normal  Intervals: normal  ST/T Wave abnormalities: normal  Conduction Disutrbances:none  Narrative Interpretation: possible left atrial enlargement  Old EKG Reviewed: none available  Labs Reviewed  BASIC METABOLIC PANEL - Abnormal; Notable for the following:    Potassium 3.3 (*)     Glucose, Bld 112 (*)     All other components within normal limits  URINALYSIS, ROUTINE W REFLEX MICROSCOPIC - Abnormal; Notable for the following:    Specific Gravity, Urine >1.030 (*)     Hgb urine dipstick SMALL (*)     Protein, ur 30 (*)     All other components within normal limits  URINE MICROSCOPIC-ADD ON - Abnormal; Notable for the following:    Squamous Epithelial / LPF MANY (*)     Bacteria, UA FEW (*)     Crystals CA OXALATE CRYSTALS  (*)     All other components within normal limits   No results found.   1. Vomiting complicating pregnancy   2. Dehydration   3. Syncope       MDM  Nausea and vomiting associated with pregnancy.  Pt hydrated in the ED.  Improved with treatment.    Syncope related to dehydration.  Pt has improved with iv fluids and antiemetics.  Feels ready for discharge.  Pt has antinausea medications at home.        Celene Kras, MD 07/07/12 [phone]

## 2012-07-07 NOTE — ED Notes (Signed)
Discharge instructions given and reviewed with patient.  Patient verbalized understanding to follow up with Dr. Emelda Fear as needed.  Patient ambulatory; discharged home in good condition.

## 2012-07-07 NOTE — ED Notes (Signed)
Patient states that a coworker caught her and she did not hit her head and was lowered to the floor.

## 2012-07-07 NOTE — ED Notes (Signed)
Patient ambulatory to restroom  ?

## 2012-07-07 NOTE — ED Notes (Signed)
Patient states she is [redacted] weeks pregnant; has been vomiting since 5pm yesterday.  Patient states she fainted about 15 minutes ago.  A&O; skin w/d. Respirations even and unlabored.

## 2012-07-07 NOTE — ED Notes (Signed)
Patient sitting up in bed laughing and talking with visitors.  Patient states she feels much better now.  3rd NS liter bolus infusing without difficulty.  Patient could not tolerate KCL tablets; gave KCL in liquid form.  Patient tolerated well.

## 2012-08-04 HISTORY — DX: Maternal care for unspecified type scar from previous cesarean delivery: O34.219

## 2012-09-24 ENCOUNTER — Encounter: Payer: Self-pay | Admitting: *Deleted

## 2012-10-13 ENCOUNTER — Inpatient Hospital Stay (HOSPITAL_COMMUNITY)
Admission: AD | Admit: 2012-10-13 | Discharge: 2012-10-13 | Disposition: A | Discharge status: Home / Self Care | Payer: Medicaid Other | Source: Ambulatory Visit | Attending: Obstetrics and Gynecology | Admitting: Obstetrics and Gynecology

## 2012-10-13 ENCOUNTER — Encounter (HOSPITAL_COMMUNITY): Payer: Self-pay

## 2012-10-13 DIAGNOSIS — O99330 Smoking (tobacco) complicating pregnancy, unspecified trimester: Secondary | ICD-10-CM | POA: Insufficient documentation

## 2012-10-13 DIAGNOSIS — R059 Cough, unspecified: Secondary | ICD-10-CM | POA: Insufficient documentation

## 2012-10-13 DIAGNOSIS — O99891 Other specified diseases and conditions complicating pregnancy: Secondary | ICD-10-CM | POA: Insufficient documentation

## 2012-10-13 DIAGNOSIS — R05 Cough: Secondary | ICD-10-CM

## 2012-10-13 MED ORDER — DEXTROMETHORPHAN HBR 10 MG/5ML PO LIQD: 15.0000 mL | Freq: Four times a day (QID) | ORAL | Status: DC | PRN

## 2012-10-13 MED ORDER — OXYMETAZOLINE HCL 0.05 % NA SOLN: 2.0000 | Freq: Two times a day (BID) | NASAL | Status: DC

## 2012-10-13 NOTE — MAU Note (Signed)
Pt reports she has had "a cough for 3 days", states she has not been able to keep anything down. Denies fever.

## 2012-10-13 NOTE — MAU Provider Note (Signed)
History     CSN: [id number]  Arrival date and time: 10/13/12 1945   First Autumn Herrera Initiated Contact with Patient 10/13/12 2028      Chief Complaint  Patient presents with  . Croup   Cough This is a new problem. The current episode started in the past 7 days. The problem has been unchanged. The problem occurs constantly. The cough is non-productive. Associated symptoms include headaches, myalgias and postnasal drip. Pertinent negatives include no chest pain, chills, ear congestion, ear pain, fever, heartburn, hemoptysis, nasal congestion, rash, rhinorrhea, sore throat, shortness of breath or sweats. Nothing aggravates the symptoms. Risk factors for lung disease include smoking/tobacco exposure. She has tried nothing for the symptoms. The treatment provided mild relief. There is no history of asthma or environmental allergies.   Pt is a 27 y/o G4P2012 at 26+2 smoker presenting with non-productive cough  # Cough: non-productive cough for 3 days associated with n/v for 1 day L sided temporal headache.  Pt denies fever, chest pain, shortness of breath or difficulty breathing.  Pt has tried "cherry-nex" and benadryl with little to no relief.  Nothing makes the coughing worse.  Denies abdominal pain, d/c, sore throat worse than cough.    OB History   Grav Para Term Preterm Abortions TAB SAB Ect Mult Living   4 2 2  1  1   2       Past Medical History  Diagnosis Date  . Degenerative disc disease, lumbar   . Disc herniation   . Pregnant   . Asthma   . Depression   . DM (diabetes mellitus), gestational 2006    on insulin    Past Surgical History  Procedure Laterality Date  . Knee surgery    . Cesarean section    . Dilation and curettage of uterus N/A 2010    Family History  Problem Relation Age of Onset  . Diabetes Other   . Hypertension Other   . Stroke Other   . Cancer Other     Leukemia and Breast   . CAD Other     History  Substance Use Topics  . Smoking status:  Current Every Day Smoker -- 1.00 packs/day    Types: Cigarettes  . Smokeless tobacco: Not on file  . Alcohol Use: No    Allergies:  Allergies  Allergen Reactions  . Penicillins Anaphylaxis  . Hydrocodone Nausea And Vomiting and Rash  . Latex Swelling and Rash  . Pitocin (Oxytocin) Other (See Comments)    Passed out when given before.    Prescriptions prior to admission  Medication Sig Dispense Refill  . acetaminophen (TYLENOL) 325 MG tablet Take by mouth every 4 (four) hours as needed for pain.      . diphenhydrAMINE (BENADRYL) 25 MG tablet Take 25 mg by mouth at bedtime as needed for itching or allergies.      . Prenatal Vit-Fe Fumarate-FA (PRENATAL MULTIVITAMIN) TABS Take 1 tablet by mouth every morning.        Review of Systems  Constitutional: Negative for fever and chills.  HENT: Positive for postnasal drip. Negative for ear pain, sore throat and rhinorrhea.   Eyes: Negative for blurred vision and double vision.  Respiratory: Positive for cough. Negative for hemoptysis and shortness of breath.   Cardiovascular: Negative for chest pain, palpitations and claudication.  Gastrointestinal: Positive for nausea. Negative for heartburn, vomiting, abdominal pain and diarrhea.  Genitourinary: Negative for dysuria and urgency.  Musculoskeletal: Positive for myalgias. Negative for joint  pain.  Skin: Negative for rash.  Neurological: Positive for headaches.  Endo/Heme/Allergies: Negative for environmental allergies. Does not bruise/bleed easily.   Physical Exam   Blood pressure 109/57, pulse 110, temperature 98.1 F (36.7 C), temperature source Oral, resp. rate 18, height 5\' 4"  (1.626 m), weight 53.978 kg (119 lb), last menstrual period 04/12/2012, SpO2 100.00%.  Physical Exam  Constitutional: She is oriented to person, place, and time. She appears well-developed and well-nourished.  HENT:  Head: Normocephalic and atraumatic.  Right Ear: Tympanic membrane, external ear and ear  canal normal. No drainage.  Left Ear: Tympanic membrane, external ear and ear canal normal. No drainage.  Nose: Mucosal edema present. No rhinorrhea. No epistaxis.  No foreign bodies. Right sinus exhibits no maxillary sinus tenderness and no frontal sinus tenderness. Left sinus exhibits frontal sinus tenderness (does not transilluminate). Left sinus exhibits no maxillary sinus tenderness.  Mouth/Throat: Uvula is midline and mucous membranes are normal. Posterior oropharyngeal edema and posterior oropharyngeal erythema present. No oropharyngeal exudate.  Eyes: Conjunctivae and EOM are normal. Pupils are equal, round, and reactive to light.  Neck: Normal range of motion. Neck supple.  Cardiovascular: Normal rate, regular rhythm, normal heart sounds and intact distal pulses.  Exam reveals no gallop and no friction rub.   No murmur heard. Respiratory: Effort normal and breath sounds normal. No respiratory distress. She has no wheezes.  Periodic dry non-productive cough throughout exam  GI: Soft. Bowel sounds are normal. There is no tenderness. There is no rebound.  Musculoskeletal: Normal range of motion. She exhibits no edema and no tenderness.  Neurological: She is alert and oriented to person, place, and time. She has normal reflexes.  Skin: Skin is warm and dry.  Psychiatric: She has a normal mood and affect. Her behavior is normal.   FHR 145 baseline, 10x10 accels, no decels, appropriate for GA No ctx per [facility] MAU Course  Procedures  MDM  Assessment and Plan  Pt is a 27 y/o G4P2012 at 26+2 smoker presenting with non-productive cough  # Cough: pt has non-productive cough, afebrile with normal O2 sats on room air.   -- afrin nasal spray prn -- dextromethorphan prn  -- discussed with pt when to return for evaluation for possible bacterial superinfection -- d/c to home -- f/u w/ pcp prn  Gregor Hams 10/13/2012, 8:41 PM

## 2012-10-19 ENCOUNTER — Encounter: Payer: Self-pay | Admitting: Adult Health

## 2012-10-19 ENCOUNTER — Other Ambulatory Visit: Payer: Medicaid Other

## 2012-10-19 NOTE — MAU Provider Note (Signed)
Attestation of Attending Supervision of Advanced Practitioner: Evaluation and management procedures were performed by the PA/NP/CNM/OB Fellow under my supervision/collaboration. Chart reviewed and agree with management and plan.  FERGUSON,JOHN V 10/19/2012 3:04 PM

## 2012-10-29 ENCOUNTER — Ambulatory Visit: Payer: Medicaid Other | Admitting: Obstetrics and Gynecology

## 2012-10-29 DIAGNOSIS — Z3482 Encounter for supervision of other normal pregnancy, second trimester: Secondary | ICD-10-CM

## 2012-10-29 LAB — CBC
HCT: 35.2 % — ABNORMAL LOW (ref 36.0–46.0)
MCH: 29.9 pg (ref 26.0–34.0)
MCV: 87.8 fL (ref 78.0–100.0)
Platelets: 236 10*3/uL (ref 150–400)
RDW: 14.3 % (ref 11.5–15.5)
WBC: 12.4 10*3/uL — ABNORMAL HIGH (ref 4.0–10.5)

## 2012-10-30 LAB — GLUCOSE TOLERANCE, 2 HOURS W/ 1HR: Glucose, Fasting: 71 mg/dL (ref 70–99)

## 2012-10-30 LAB — ANTIBODY SCREEN: Antibody Screen: NEGATIVE

## 2012-11-01 LAB — HSV 2 ANTIBODY, IGG: HSV 2 Glycoprotein G Ab, IgG: <0.1 IV

## 2012-11-08 ENCOUNTER — Encounter: Payer: Self-pay | Admitting: Obstetrics and Gynecology

## 2012-11-08 ENCOUNTER — Ambulatory Visit (INDEPENDENT_AMBULATORY_CARE_PROVIDER_SITE_OTHER): Payer: Medicaid Other | Admitting: Obstetrics and Gynecology

## 2012-11-08 VITALS — BP 112/64 | Wt 124.0 lb

## 2012-11-08 DIAGNOSIS — Z3490 Encounter for supervision of normal pregnancy, unspecified, unspecified trimester: Secondary | ICD-10-CM

## 2012-11-08 DIAGNOSIS — Z331 Pregnant state, incidental: Secondary | ICD-10-CM

## 2012-11-08 DIAGNOSIS — Z1389 Encounter for screening for other disorder: Secondary | ICD-10-CM

## 2012-11-08 DIAGNOSIS — O34219 Maternal care for unspecified type scar from previous cesarean delivery: Secondary | ICD-10-CM

## 2012-11-08 DIAGNOSIS — O09299 Supervision of pregnancy with other poor reproductive or obstetric history, unspecified trimester: Secondary | ICD-10-CM

## 2012-11-08 DIAGNOSIS — O99330 Smoking (tobacco) complicating pregnancy, unspecified trimester: Secondary | ICD-10-CM

## 2012-11-08 DIAGNOSIS — Z8632 Personal history of gestational diabetes: Secondary | ICD-10-CM

## 2012-11-08 LAB — POCT URINALYSIS DIPSTICK
Blood, UA: NEGATIVE
Glucose, UA: NEGATIVE
Nitrite, UA: NEGATIVE

## 2012-11-08 MED ORDER — HYDROCODONE-ACETAMINOPHEN 5-325 MG PO TABS: 1.0000 | ORAL_TABLET | Freq: Four times a day (QID) | ORAL | Status: DC | PRN

## 2012-11-08 NOTE — Progress Notes (Signed)
Pain at rlq similar to prior pregnancy , will evaluate at cesarean. Will rx hydrocodone 5 for prn use

## 2012-11-08 NOTE — Progress Notes (Signed)
C/o pain and pressure at old c-section scar.

## 2012-11-08 NOTE — Patient Instructions (Signed)
Use pain meds sparingly for muscle pain

## 2012-11-17 ENCOUNTER — Ambulatory Visit (INDEPENDENT_AMBULATORY_CARE_PROVIDER_SITE_OTHER): Payer: Medicaid Other | Admitting: Obstetrics and Gynecology

## 2012-11-17 ENCOUNTER — Encounter: Payer: Self-pay | Admitting: Obstetrics and Gynecology

## 2012-11-17 ENCOUNTER — Telehealth: Payer: Self-pay | Admitting: Adult Health

## 2012-11-17 VITALS — BP 94/68 | Wt 124.6 lb

## 2012-11-17 DIAGNOSIS — Z1389 Encounter for screening for other disorder: Secondary | ICD-10-CM

## 2012-11-17 DIAGNOSIS — O34219 Maternal care for unspecified type scar from previous cesarean delivery: Secondary | ICD-10-CM

## 2012-11-17 DIAGNOSIS — Z331 Pregnant state, incidental: Secondary | ICD-10-CM

## 2012-11-17 DIAGNOSIS — Z3483 Encounter for supervision of other normal pregnancy, third trimester: Secondary | ICD-10-CM

## 2012-11-17 LAB — POCT URINALYSIS DIPSTICK
Blood, UA: NEGATIVE
Nitrite, UA: NEGATIVE
Protein, UA: NEGATIVE

## 2012-11-17 NOTE — Patient Instructions (Signed)
.  Preterm Labor  Preterm labor is when labor starts at less than 37 weeks of pregnancy. The normal length of a pregnancy is 39 to 41 weeks.  CAUSES  Often, there is no identifiable underlying cause as to why a woman goes into preterm labor. However, one of the most common known causes of preterm labor is infection. Infections of the uterus, cervix, vagina, amniotic sac, bladder, kidney, or even the lungs (pneumonia) can cause labor to start. Other causes of preterm labor include:   Urogenital infections, such as yeast infections and bacterial vaginosis.   Uterine abnormalities (uterine shape, uterine septum, fibroids, bleeding from the placenta).   A cervix that has been operated on and opens prematurely.   Malformations in the baby.   Multiple gestations (twins, triplets, and so on).   Breakage of the amniotic sac.  Additional risk factors for preterm labor include:   Previous history of preterm labor.   Premature rupture of membranes (PROM).   A placenta that covers the opening of the cervix (placenta previa).   A placenta that separates from the uterus (placenta abruption).   A cervix that is too weak to hold the baby in the uterus (incompetence cervix).   Having too much fluid in the amniotic sac (polyhydramnios).   Taking illegal drugs or smoking while pregnant.   Not gaining enough weight while pregnant.   Women younger than 18 and older than 27 years old.   Low socioeconomic status.   African-American ethnicity.  SYMPTOMS  Signs and symptoms of preterm labor include:   Menstrual-like cramps.   Contractions that are 30 to 70 seconds apart, become very regular, closer together, and are more intense and painful.   Contractions that start on the top of the uterus and spread down to the lower abdomen and back.   A sense of increased pelvic pressure or back pain.   A watery or bloody discharge that comes from the vagina.  DIAGNOSIS   A diagnosis can be confirmed by:   A vaginal exam.   An  ultrasound of the cervix.   Sampling (swabbing) cervico-vaginal secretions. These samples can be tested for the presence of fetal fibronectin. This is a protein found in cervical discharge which is associated with preterm labor.   Fetal monitoring.  TREATMENT   Depending on the length of the pregnancy and other circumstances, a caregiver may suggest bed rest. If necessary, there are medicines that can be given to stop contractions and to quicken fetal lung maturity. If labor happens before 34 weeks of pregnancy, a prolonged hospital stay may be recommended. Treatment depends on the condition of both the mother and baby.  PREVENTION  There are some things a mother can do to lower the risk of preterm labor in future pregnancies. A woman can:    Stop smoking.   Maintain healthy weight gain and avoid chemicals and drugs that are not necessary.   Be watchful for any type of infection.   Inform her caregiver if she has a known history of preterm labor.  Document Released: 10/11/2003 Document Revised: 10/13/2011 Document Reviewed: 11/15/2010  [employer] Patient Information 2013 [employer].

## 2012-11-17 NOTE — Progress Notes (Signed)
Contractions every 15 minutes,minimal pressure, mild.. "leaking fluid" since last pm,x 1 and none since. Now no bleeding.no dischg.  No suspicion of SROM or PTL PTL sheet given

## 2012-11-17 NOTE — Telephone Encounter (Signed)
Spoke with pt. [redacted] weeks pregnant. Has had light contractions since 7:30 pm, coming every 15 minutes. "losing fluid" since 11:30pm; +baby movement, no bleeding. Pt states she don't think it's urine that she is leaking. appt scheduled at 10:00am to see Dr. Emelda Fear.

## 2012-11-22 ENCOUNTER — Encounter: Payer: Medicaid Other | Admitting: Obstetrics and Gynecology

## 2012-11-22 ENCOUNTER — Other Ambulatory Visit (HOSPITAL_COMMUNITY): Payer: Self-pay | Admitting: Obstetrics and Gynecology

## 2012-11-22 ENCOUNTER — Encounter: Payer: Self-pay | Admitting: *Deleted

## 2012-12-01 ENCOUNTER — Ambulatory Visit (INDEPENDENT_AMBULATORY_CARE_PROVIDER_SITE_OTHER): Payer: Medicaid Other | Admitting: Obstetrics and Gynecology

## 2012-12-01 ENCOUNTER — Encounter: Payer: Self-pay | Admitting: Obstetrics and Gynecology

## 2012-12-01 VITALS — BP 102/70 | Wt 127.0 lb

## 2012-12-01 DIAGNOSIS — Z3493 Encounter for supervision of normal pregnancy, unspecified, third trimester: Secondary | ICD-10-CM

## 2012-12-01 DIAGNOSIS — O34219 Maternal care for unspecified type scar from previous cesarean delivery: Secondary | ICD-10-CM

## 2012-12-01 DIAGNOSIS — Z331 Pregnant state, incidental: Secondary | ICD-10-CM

## 2012-12-01 DIAGNOSIS — Z1389 Encounter for screening for other disorder: Secondary | ICD-10-CM

## 2012-12-01 LAB — POCT URINALYSIS DIPSTICK
Blood, UA: NEGATIVE
Ketones, UA: NEGATIVE
Protein, UA: NEGATIVE

## 2012-12-01 NOTE — Progress Notes (Signed)
Cramps. 10-15 sec q 30 min. No bleeding, no srom, no dischg. Able to sleep thru. + insomnia.

## 2012-12-01 NOTE — Patient Instructions (Addendum)
If contractions more than 6 /hr, consider eval at [facility] or if bleeding or increase in vag dischg. Preterm Labor Preterm labor is when labor starts at less than 37 weeks of pregnancy. The normal length of a pregnancy is 39 to 41 weeks. CAUSES Often, there is no identifiable underlying cause as to why a woman goes into preterm labor. However, one of the most common known causes of preterm labor is infection. Infections of the uterus, cervix, vagina, amniotic sac, bladder, kidney, or even the lungs (pneumonia) can cause labor to start. Other causes of preterm labor include:  Urogenital infections, such as yeast infections and bacterial vaginosis.  Uterine abnormalities (uterine shape, uterine septum, fibroids, bleeding from the placenta).  A cervix that has been operated on and opens prematurely.  Malformations in the baby.  Multiple gestations (twins, triplets, and so on).  Breakage of the amniotic sac. Additional risk factors for preterm labor include:  Previous history of preterm labor.  Premature rupture of membranes (PROM).  A placenta that covers the opening of the cervix (placenta previa).  A placenta that separates from the uterus (placenta abruption).  A cervix that is too weak to hold the baby in the uterus (incompetence cervix).  Having too much fluid in the amniotic sac (polyhydramnios).  Taking illegal drugs or smoking while pregnant.  Not gaining enough weight while pregnant.  Women younger than 34 and older than 27 years old.  Low socioeconomic status.  African-American ethnicity. SYMPTOMS Signs and symptoms of preterm labor include:  Menstrual-like cramps.  Contractions that are 30 to 70 seconds apart, become very regular, closer together, and are more intense and painful.  Contractions that start on the top of the uterus and spread down to the lower abdomen and back.  A sense of increased pelvic pressure or back pain.  A watery or bloody  discharge that comes from the vagina. DIAGNOSIS  A diagnosis can be confirmed by:  A vaginal exam.  An ultrasound of the cervix.  Sampling (swabbing) cervico-vaginal secretions. These samples can be tested for the presence of fetal fibronectin. This is a protein found in cervical discharge which is associated with preterm labor.  Fetal monitoring. TREATMENT  Depending on the length of the pregnancy and other circumstances, a caregiver may suggest bed rest. If necessary, there are medicines that can be given to stop contractions and to quicken fetal lung maturity. If labor happens before 34 weeks of pregnancy, a prolonged hospital stay may be recommended. Treatment depends on the condition of both the mother and baby. PREVENTION There are some things a mother can do to lower the risk of preterm labor in future pregnancies. A woman can:   Stop smoking.  Maintain healthy weight gain and avoid chemicals and drugs that are not necessary.  Be watchful for any type of infection.  Inform her caregiver if she has a known history of preterm labor. Document Released: 10/11/2003 Document Revised: 10/13/2011 Document Reviewed: 11/15/2010 [facility] Patient Information 2013 [place], [place].

## 2012-12-06 ENCOUNTER — Encounter (HOSPITAL_COMMUNITY): Payer: Self-pay

## 2012-12-06 ENCOUNTER — Inpatient Hospital Stay (HOSPITAL_COMMUNITY)
Admission: AD | Admit: 2012-12-06 | Discharge: 2012-12-07 | Disposition: A | Discharge status: Home / Self Care | Payer: Medicaid Other | Source: Ambulatory Visit | Attending: Family Medicine | Admitting: Family Medicine

## 2012-12-06 DIAGNOSIS — O34219 Maternal care for unspecified type scar from previous cesarean delivery: Secondary | ICD-10-CM

## 2012-12-06 DIAGNOSIS — O479 False labor, unspecified: Secondary | ICD-10-CM

## 2012-12-06 DIAGNOSIS — O4700 False labor before 37 completed weeks of gestation, unspecified trimester: Secondary | ICD-10-CM | POA: Insufficient documentation

## 2012-12-06 LAB — WET PREP, GENITAL
Clue Cells Wet Prep HPF POC: NONE SEEN
Trich, Wet Prep: NONE SEEN
Yeast Wet Prep HPF POC: NONE SEEN

## 2012-12-06 LAB — URINALYSIS, ROUTINE W REFLEX MICROSCOPIC
Bilirubin Urine: NEGATIVE
Glucose, UA: NEGATIVE mg/dL
Ketones, ur: 15 mg/dL — AB
Leukocytes, UA: NEGATIVE
pH: 6 (ref 5.0–8.0)

## 2012-12-06 LAB — URINE MICROSCOPIC-ADD ON

## 2012-12-06 MED ORDER — NIFEDIPINE 10 MG PO CAPS
10.0000 mg | ORAL_CAPSULE | Freq: Four times a day (QID) | ORAL | Status: DC
  Administered 2012-12-06: 10 mg via ORAL
  Filled 2012-12-06: qty 1

## 2012-12-06 NOTE — MAU Provider Note (Signed)
History     CSN: [id number]  Arrival date and time: 12/06/12 2242   None     Chief Complaint  Patient presents with  . Contractions   HPI 27 y.o. [id number] at [redacted]w[redacted]d with contractions for 3 days, getting more frequent and intense tonight. Feeling ctx q 5 min. Last intercourse 3-4 days ago. No dysuria. No LOF, no bleeding. Baby moving well. Was working out in yard all day today.   Prenatal care at [facility]. Last visit 4/30. States she was 1 cm dilated. No complications this pregnancy.  Two prior c-sections. No preterm deliveries or labor.   OB History   Grav Para Term Preterm Abortions TAB SAB Ect Mult Living   4 2 2  1  1   2       Past Medical History  Diagnosis Date  . Degenerative disc disease, lumbar   . Disc herniation   . Pregnant   . Asthma   . Depression   . DM (diabetes mellitus), gestational 2006    on insulin  . History of cesarean delivery, currently pregnant 11/08/2012  . Gestational diabetes     Past Surgical History  Procedure Laterality Date  . Knee surgery    . Cesarean section    . Dilation and curettage of uterus N/A 2010    Family History  Problem Relation Age of Onset  . Diabetes Other   . Hypertension Other   . Stroke Other   . Cancer Other     Leukemia and Breast   . CAD Other     History  Substance Use Topics  . Smoking status: Current Every Day Smoker -- 1.00 packs/day    Types: Cigarettes  . Smokeless tobacco: Not on file  . Alcohol Use: No    Allergies:  Allergies  Allergen Reactions  . Penicillins Anaphylaxis  . Latex Swelling and Rash  . Pitocin (Oxytocin) Other (See Comments)    Passed out when given before.    Prescriptions prior to admission  Medication Sig Dispense Refill  . acetaminophen (TYLENOL) 325 MG tablet Take by mouth every 4 (four) hours as needed for pain.      . calcium carbonate (TUMS EX) 750 MG chewable tablet Chew 1 tablet by mouth 3 (three) times daily.      . diphenhydrAMINE (BENADRYL) 25 MG  tablet Take 25 mg by mouth at bedtime as needed for itching or allergies.      Marland Kitchen HYDROcodone-acetaminophen (NORCO/VICODIN) 5-325 MG per tablet TAKE 1 TABLET BY MOUTH EVERY 6 HOURS AS NEEDED FOR PAIN  30 tablet  1  . Prenatal Vit-Fe Fumarate-FA (PRENATAL MULTIVITAMIN) TABS Take 1 tablet by mouth every morning.      Marland Kitchen Dextromethorphan HBr 10 MG/5ML LIQD Take 15 mLs (30 mg total) by mouth every 6 (six) hours as needed.  420 mL  0  . oxymetazoline (AFRIN NASAL SPRAY) 0.05 % nasal spray Place 2 sprays into the nose 2 (two) times daily.  30 mL  0    Review of Systems  Constitutional: Negative for fever and chills.  Eyes: Negative for blurred vision and double vision.  Gastrointestinal: Negative for nausea and vomiting.  Genitourinary: Negative for dysuria.  Neurological: Negative for dizziness and headaches.   Physical Exam   Blood pressure 109/54, pulse 100, temperature 98 F (36.7 C), temperature source Oral, resp. rate 18, height 5\' 4"  (1.626 m), weight 58.514 kg (129 lb), last menstrual period 04/12/2012.  Physical Exam  Constitutional: She is oriented  to person, place, and time. She appears well-developed and well-nourished. No distress.  HENT:  Head: Normocephalic and atraumatic.  Eyes: Conjunctivae and EOM are normal.  Neck: Normal range of motion.  Cardiovascular: Normal rate, regular rhythm and normal heart sounds.   Respiratory: Effort normal and breath sounds normal. No respiratory distress.  GI: She exhibits no distension. There is no tenderness. There is no rebound and no guarding.  Genitourinary:  Normal external genitalia. Normal vagina. Moderate white discharge. No blood or pooling. Cervix Ft-1, thick. Posterior.   Musculoskeletal: Normal range of motion. She exhibits no edema and no tenderness.  Neurological: She is alert and oriented to person, place, and time.  Skin: Skin is warm and dry.   FHTs:  135, mod variability, accels present, no decels - Cat I TOCO:  3 ctx in  2 hours. Irritability   Results for orders placed during the hospital encounter of 12/06/12 (from the past 24 hour(s))  URINALYSIS, ROUTINE W REFLEX MICROSCOPIC     Status: Abnormal   Collection Time    12/06/12 10:50 PM      Result Value Range   Color, Urine YELLOW  YELLOW   APPearance CLEAR  CLEAR   Specific Gravity, Urine 1.025  1.005 - 1.030   pH 6.0  5.0 - 8.0   Glucose, UA NEGATIVE  NEGATIVE mg/dL   Hgb urine dipstick SMALL (*) NEGATIVE   Bilirubin Urine NEGATIVE  NEGATIVE   Ketones, ur 15 (*) NEGATIVE mg/dL   Protein, ur NEGATIVE  NEGATIVE mg/dL   Urobilinogen, UA 0.2  0.0 - 1.0 mg/dL   Nitrite NEGATIVE  NEGATIVE   Leukocytes, UA NEGATIVE  NEGATIVE  URINE MICROSCOPIC-ADD ON     Status: None   Collection Time    12/06/12 10:50 PM      Result Value Range   Squamous Epithelial / LPF RARE  RARE   WBC, UA 0-2  <3 WBC/hpf   RBC / HPF 7-10  <3 RBC/hpf   Bacteria, UA RARE  RARE  WET PREP, GENITAL     Status: Abnormal   Collection Time    12/06/12 11:27 PM      Result Value Range   Yeast Wet Prep HPF POC NONE SEEN  NONE SEEN   Trich, Wet Prep NONE SEEN  NONE SEEN   Clue Cells Wet Prep HPF POC NONE SEEN  NONE SEEN   WBC, Wet Prep HPF POC FEW (*) NONE SEEN    MAU Course  Procedures  Procardia 10 mg given in MAU - no relief, just made her nauseated  Assessment and Plan  26 y.o. [id number] at [redacted]w[redacted]d with contractions. - Cervix FT to 1 cm. - Not in labor.  - Percocet given in ED for pain.  - FHTs reassuring/reactive. - Stable for discharge home. - Follow up with [facility] as scheduled or as needed.  Napoleon Form 12/06/2012, 11:12 PM

## 2012-12-06 NOTE — MAU Note (Signed)
Pt G4 P2 at 34wks having contractions every .  Previous C/S x 2.

## 2012-12-07 ENCOUNTER — Other Ambulatory Visit: Payer: Self-pay | Admitting: Obstetrics and Gynecology

## 2012-12-07 DIAGNOSIS — O4700 False labor before 37 completed weeks of gestation, unspecified trimester: Secondary | ICD-10-CM

## 2012-12-07 LAB — GC/CHLAMYDIA PROBE AMP: CT Probe RNA: NEGATIVE

## 2012-12-07 MED ORDER — ONDANSETRON 4 MG PO TBDP
4.0000 mg | ORAL_TABLET | Freq: Four times a day (QID) | ORAL | Status: DC | PRN
  Administered 2012-12-07: 4 mg via ORAL
  Filled 2012-12-07: qty 1

## 2012-12-07 MED ORDER — OXYCODONE-ACETAMINOPHEN 5-325 MG PO TABS
2.0000 | ORAL_TABLET | Freq: Once | ORAL | Status: AC
  Administered 2012-12-07: 2 via ORAL
  Filled 2012-12-07: qty 2

## 2012-12-07 NOTE — MAU Provider Note (Signed)
Chart reviewed and agree with management and plan.  

## 2012-12-08 ENCOUNTER — Encounter: Payer: Medicaid Other | Admitting: Obstetrics and Gynecology

## 2012-12-08 ENCOUNTER — Other Ambulatory Visit: Payer: Self-pay | Admitting: Obstetrics and Gynecology

## 2012-12-13 ENCOUNTER — Ambulatory Visit (INDEPENDENT_AMBULATORY_CARE_PROVIDER_SITE_OTHER): Payer: Medicaid Other | Admitting: Obstetrics and Gynecology

## 2012-12-13 VITALS — BP 108/78 | Wt 122.2 lb

## 2012-12-13 DIAGNOSIS — R350 Frequency of micturition: Secondary | ICD-10-CM

## 2012-12-13 DIAGNOSIS — Z331 Pregnant state, incidental: Secondary | ICD-10-CM

## 2012-12-13 DIAGNOSIS — Z1389 Encounter for screening for other disorder: Secondary | ICD-10-CM

## 2012-12-13 DIAGNOSIS — R300 Dysuria: Secondary | ICD-10-CM

## 2012-12-13 DIAGNOSIS — O34219 Maternal care for unspecified type scar from previous cesarean delivery: Secondary | ICD-10-CM

## 2012-12-13 DIAGNOSIS — O2390 Unspecified genitourinary tract infection in pregnancy, unspecified trimester: Secondary | ICD-10-CM

## 2012-12-13 LAB — POCT URINALYSIS DIPSTICK
Glucose, UA: NEGATIVE
Ketones, UA: NEGATIVE

## 2012-12-13 MED ORDER — NITROFURANTOIN MONOHYD MACRO 100 MG PO CAPS: 100.0000 mg | ORAL_CAPSULE | Freq: Two times a day (BID) | ORAL | Status: DC

## 2012-12-13 NOTE — Patient Instructions (Signed)
Take antibiogticsUrinary Tract Infection in Pregnancy A urinary tract infection (UTI) is a bacterial infection of the urinary tract. Infection of the urinary tract can include the ureters, kidneys (pyelonephritis), bladder (cystitis), and urethra (urethritis). All pregnant women should be screened for bacteria in the urinary tract. Identifying and treating a UTI will decrease the risk of preterm labor and developing more serious infections in both the mother and baby. CAUSES Bacteria germs cause almost all UTIs. There are many factors that can increase your chances of getting a UTI during pregnancy. These include:  Having a short urethra.  Poor toilet and hygiene habits.  Sexual intercourse.  Blockage of urine along the urinary tract.  Problems with the pelvic muscles or nerves.  Diabetes.  Obesity.  Bladder problems after having several children.  Previous history of UTI. SYMPTOMS   Pain, burning, or a stinging feeling when urinating.  Suddenly feeling the need to urinate right away (urgency).  Loss of bladder control (urinary incontinence).  Frequent urination, more than is common with pregnancy.  Lower abdominal or back discomfort.  Bad smelling urine.  Cloudy urine.  Blood in the urine (hematuria).  Fever. When the kidneys are infected, the symptoms may be:  Back pain.  Flank pain on the right side more so than the left.  Fever.  Chills.  Nausea.  Vomiting. DIAGNOSIS   Urine tests.  Additional tests and procedures may include:  Ultrasound of the kidneys, ureters, bladder, and urethra.  Looking in the bladder with a lighted tube (cystoscopy).  Certain X-ray studies only when absolutely necessary. Finding out the results of your test Ask when your test results will be ready. Make sure you get your test results. TREATMENT  Antibiotic medicine by mouth.  Antibiotics given through the vein (intravenously), if needed. HOME CARE INSTRUCTIONS    Take your antibiotics as directed. Finish them even if you start to feel better. Only take medicine as directed by your caregiver.  Drink enough fluids to keep your urine clear or pale yellow.  Do not have sexual intercourse until the infection is gone and your caregiver says it is okay.  Make sure you are tested for UTIs throughout your pregnancy if you get one. These infections often come back. Preventing a UTI in the future:  Practice good toilet habits. Always wipe from front to back. Use the tissue only once.  Do not hold your urine. Empty your bladder as soon as possible when the urge comes.  Do not douche or use deodorant sprays.  Wash with soap and warm water around the genital area and the anus.  Empty your bladder before and after sexual intercourse.  Wear underwear with a cotton crotch.  Avoid caffeine and carbonated drinks. They can irritate the bladder.  Drink cranberry juice or take cranberry pills. This may decrease the risk of getting a UTI.  Do not drink alcohol.  Keep all your appointments and tests as scheduled. SEEK MEDICAL CARE IF:   Your symptoms get worse.  You are still having fevers 2 or more days after treatment begins.  You develop a rash.  You feel that you are having problems with medicines prescribed.  You develop abnormal vaginal discharge. SEEK IMMEDIATE MEDICAL CARE IF:   You develop back or flank pain.  You develop chills.  You have blood in your urine.  You develop nausea and vomiting.  You develop contractions of your uterus.  You have a gush of fluid from the vagina. MAKE SURE YOU:  Understand these instructions.  Will watch your condition.  Will get help right away if you are not doing well or get worse. Document Released: 11/15/2010 Document Revised: 10/13/2011 Document Reviewed: 11/15/2010 [facility] Patient Information 2013 [place], [place].

## 2012-12-13 NOTE — Progress Notes (Signed)
Pt states having irregular contractions, went to [facility] to be evaluated. Pt states no dilation and was sent home. Good FM now,  A: preg 35wk , low risk preg.  Prior c/s x 2 for repeat June 10 jvferg

## 2012-12-14 ENCOUNTER — Encounter (HOSPITAL_COMMUNITY): Payer: Self-pay | Admitting: *Deleted

## 2012-12-14 ENCOUNTER — Emergency Department (HOSPITAL_COMMUNITY)
Admission: EM | Admit: 2012-12-14 | Discharge: 2012-12-14 | Discharge status: Against Medical Advice | Payer: Medicaid Other | Attending: Emergency Medicine | Admitting: Emergency Medicine

## 2012-12-14 DIAGNOSIS — O9989 Other specified diseases and conditions complicating pregnancy, childbirth and the puerperium: Secondary | ICD-10-CM | POA: Insufficient documentation

## 2012-12-14 DIAGNOSIS — J45909 Unspecified asthma, uncomplicated: Secondary | ICD-10-CM | POA: Insufficient documentation

## 2012-12-14 DIAGNOSIS — N390 Urinary tract infection, site not specified: Secondary | ICD-10-CM | POA: Insufficient documentation

## 2012-12-14 DIAGNOSIS — O2390 Unspecified genitourinary tract infection in pregnancy, unspecified trimester: Secondary | ICD-10-CM | POA: Insufficient documentation

## 2012-12-14 DIAGNOSIS — O99330 Smoking (tobacco) complicating pregnancy, unspecified trimester: Secondary | ICD-10-CM | POA: Insufficient documentation

## 2012-12-14 LAB — URINALYSIS, ROUTINE W REFLEX MICROSCOPIC
Glucose, UA: NEGATIVE mg/dL
Hgb urine dipstick: NEGATIVE
Protein, ur: 30 mg/dL — AB
Urobilinogen, UA: 1 mg/dL (ref 0.0–1.0)

## 2012-12-14 LAB — URINALYSIS, MICROSCOPIC ONLY

## 2012-12-14 NOTE — ED Notes (Signed)
Pt still not in waiting room or outside. Pt left w/o telling anyone after triage.

## 2012-12-14 NOTE — ED Notes (Signed)
Pt not in waiting room when called to tx room.

## 2012-12-14 NOTE — ED Notes (Signed)
Pt reports being tx for UTI today by dr Pierce Crane. Pt states not better.

## 2012-12-15 ENCOUNTER — Telehealth: Payer: Self-pay | Admitting: Obstetrics and Gynecology

## 2012-12-15 ENCOUNTER — Encounter: Payer: Medicaid Other | Admitting: Obstetrics and Gynecology

## 2012-12-15 ENCOUNTER — Other Ambulatory Visit: Payer: Self-pay | Admitting: Obstetrics and Gynecology

## 2012-12-15 ENCOUNTER — Ambulatory Visit (INDEPENDENT_AMBULATORY_CARE_PROVIDER_SITE_OTHER): Payer: Medicaid Other | Admitting: Obstetrics and Gynecology

## 2012-12-15 VITALS — BP 90/50 | Temp 100.8°F | Wt 125.6 lb

## 2012-12-15 DIAGNOSIS — Z1389 Encounter for screening for other disorder: Secondary | ICD-10-CM

## 2012-12-15 DIAGNOSIS — O368131 Decreased fetal movements, third trimester, fetus 1: Secondary | ICD-10-CM

## 2012-12-15 DIAGNOSIS — Z331 Pregnant state, incidental: Secondary | ICD-10-CM

## 2012-12-15 DIAGNOSIS — O368190 Decreased fetal movements, unspecified trimester, not applicable or unspecified: Secondary | ICD-10-CM

## 2012-12-15 LAB — POCT URINALYSIS DIPSTICK
Glucose, UA: NEGATIVE
Ketones, UA: NEGATIVE
Leukocytes, UA: NEGATIVE

## 2012-12-15 NOTE — Telephone Encounter (Signed)
Completed by phone after fax efforts failed, this date

## 2012-12-15 NOTE — Telephone Encounter (Signed)
Pt states treated for UTI on Monday, now c/o cramping, fever, diarrhea, and vomiting. Taking phenergan but not helping. Has taken tylenol for fever, fever has came down. Feels like UTI getting worse. Appointment made for Pt to see a provider today at 2:15 pm.

## 2012-12-15 NOTE — Progress Notes (Signed)
Pt here today with c/o fever and body aches since about 3 this morning. Pt stated that she has not felt the baby move at all today. Pt hooked up to NST for evaluation.  NST REACTIVE, FHR IN 170 RANGE, FETAL MOVEMENT NOTED ON MONITOR. ABD BOWEL SOUNDS ACTIVE , NORMAL PITCH. aBD SOFT.DENIES S &S OF SROM.  GI UPSET,  PLAN: LIQUIDS /TYLENOL/ F/U 1 WK

## 2012-12-15 NOTE — Patient Instructions (Signed)
GASTROENTERITISPlace gastroenteritis patient instructions here. Place gastroenteritis patient instructions here.

## 2012-12-16 LAB — URINE CULTURE: Colony Count: >=100000

## 2012-12-17 ENCOUNTER — Inpatient Hospital Stay (HOSPITAL_COMMUNITY)
Admission: AD | Admit: 2012-12-17 | Discharge: 2012-12-20 | DRG: 781 | Disposition: A | Discharge status: Home / Self Care | Payer: Medicaid Other | Source: Ambulatory Visit | Attending: Obstetrics & Gynecology | Admitting: Obstetrics & Gynecology

## 2012-12-17 ENCOUNTER — Telehealth: Payer: Self-pay | Admitting: *Deleted

## 2012-12-17 ENCOUNTER — Ambulatory Visit (INDEPENDENT_AMBULATORY_CARE_PROVIDER_SITE_OTHER): Payer: Medicaid Other | Admitting: Obstetrics and Gynecology

## 2012-12-17 ENCOUNTER — Encounter (HOSPITAL_COMMUNITY): Payer: Self-pay | Admitting: *Deleted

## 2012-12-17 VITALS — BP 100/70 | Temp 99.1°F | Wt 127.0 lb

## 2012-12-17 DIAGNOSIS — O34219 Maternal care for unspecified type scar from previous cesarean delivery: Secondary | ICD-10-CM

## 2012-12-17 DIAGNOSIS — N12 Tubulo-interstitial nephritis, not specified as acute or chronic: Secondary | ICD-10-CM

## 2012-12-17 DIAGNOSIS — O2390 Unspecified genitourinary tract infection in pregnancy, unspecified trimester: Secondary | ICD-10-CM

## 2012-12-17 DIAGNOSIS — O2303 Infections of kidney in pregnancy, third trimester: Secondary | ICD-10-CM

## 2012-12-17 DIAGNOSIS — Z1389 Encounter for screening for other disorder: Secondary | ICD-10-CM

## 2012-12-17 DIAGNOSIS — B958 Unspecified staphylococcus as the cause of diseases classified elsewhere: Secondary | ICD-10-CM

## 2012-12-17 DIAGNOSIS — O9989 Other specified diseases and conditions complicating pregnancy, childbirth and the puerperium: Secondary | ICD-10-CM

## 2012-12-17 DIAGNOSIS — Z331 Pregnant state, incidental: Secondary | ICD-10-CM

## 2012-12-17 LAB — CBC WITH DIFFERENTIAL/PLATELET
Basophils Absolute: 0 10*3/uL (ref 0.0–0.1)
Basophils Relative: 0 % (ref 0–1)
Lymphocytes Relative: 9 % — ABNORMAL LOW (ref 12–46)
MCHC: 33.7 g/dL (ref 30.0–36.0)
Monocytes Absolute: 1.6 10*3/uL — ABNORMAL HIGH (ref 0.1–1.0)
Neutro Abs: 17.2 10*3/uL — ABNORMAL HIGH (ref 1.7–7.7)
Neutrophils Relative %: 83 % — ABNORMAL HIGH (ref 43–77)
Platelets: 222 10*3/uL (ref 150–400)
RDW: 13.5 % (ref 11.5–15.5)
WBC: 20.7 10*3/uL — ABNORMAL HIGH (ref 4.0–10.5)

## 2012-12-17 LAB — TYPE AND SCREEN
ABO/RH(D): A POS
Antibody Screen: NEGATIVE

## 2012-12-17 LAB — POCT URINALYSIS DIPSTICK
Leukocytes, UA: NEGATIVE
Nitrite, UA: NEGATIVE
Protein, UA: 1

## 2012-12-17 MED ORDER — HYDROCODONE-ACETAMINOPHEN 5-325 MG PO TABS
1.0000 | ORAL_TABLET | ORAL | Status: DC | PRN
  Administered 2012-12-17: 2 via ORAL
  Administered 2012-12-17: 1 via ORAL
  Administered 2012-12-18 – 2012-12-19 (×11): 2 via ORAL
  Filled 2012-12-17: qty 1
  Filled 2012-12-17 (×12): qty 2

## 2012-12-17 MED ORDER — CITRIC ACID-SODIUM CITRATE 334-500 MG/5ML PO SOLN
ORAL | Status: AC
  Administered 2012-12-17: 30 mL via ORAL
  Filled 2012-12-17: qty 15

## 2012-12-17 MED ORDER — DIPHENHYDRAMINE HCL 25 MG PO CAPS
25.0000 mg | ORAL_CAPSULE | Freq: Every evening | ORAL | Status: DC | PRN
  Filled 2012-12-17: qty 1

## 2012-12-17 MED ORDER — SODIUM CHLORIDE 0.9 % IV SOLN
INTRAVENOUS | Status: DC | PRN
  Administered 2012-12-17 – 2012-12-19 (×5): via INTRAVENOUS

## 2012-12-17 MED ORDER — ZOLPIDEM TARTRATE 5 MG PO TABS
5.0000 mg | ORAL_TABLET | Freq: Every evening | ORAL | Status: DC | PRN
  Administered 2012-12-17 – 2012-12-19 (×3): 5 mg via ORAL
  Filled 2012-12-17 (×3): qty 1

## 2012-12-17 MED ORDER — ACETAMINOPHEN 325 MG PO TABS: 650.0000 mg | ORAL_TABLET | ORAL | Status: DC | PRN

## 2012-12-17 MED ORDER — PRENATAL MULTIVITAMIN CH
1.0000 | ORAL_TABLET | Freq: Every day | ORAL | Status: DC
  Administered 2012-12-18 – 2012-12-19 (×2): 1 via ORAL
  Filled 2012-12-17 (×2): qty 1

## 2012-12-17 MED ORDER — DEXTROSE 5 % IV SOLN
1.0000 g | Freq: Two times a day (BID) | INTRAVENOUS | Status: DC
  Administered 2012-12-17 – 2012-12-19 (×6): 1 g via INTRAVENOUS
  Filled 2012-12-17 (×7): qty 10

## 2012-12-17 MED ORDER — GUAIFENESIN 100 MG/5ML PO SOLN
200.0000 mg | ORAL | Status: DC | PRN
  Administered 2012-12-17 – 2012-12-19 (×5): 200 mg via ORAL
  Filled 2012-12-17 (×5): qty 15

## 2012-12-17 MED ORDER — CALCIUM CARBONATE ANTACID 500 MG PO CHEW
2.0000 | CHEWABLE_TABLET | ORAL | Status: DC | PRN
  Administered 2012-12-17 – 2012-12-18 (×2): 400 mg via ORAL
  Filled 2012-12-17: qty 1

## 2012-12-17 MED ORDER — CALCIUM CARBONATE ANTACID 500 MG PO CHEW
1.0000 | CHEWABLE_TABLET | Freq: Three times a day (TID) | ORAL | Status: DC | PRN
  Administered 2012-12-19: 200 mg via ORAL
  Filled 2012-12-17 (×2): qty 2

## 2012-12-17 MED ORDER — PRENATAL MULTIVITAMIN CH: 1.0000 | ORAL_TABLET | Freq: Every day | ORAL | Status: DC

## 2012-12-17 MED ORDER — DOCUSATE SODIUM 100 MG PO CAPS
100.0000 mg | ORAL_CAPSULE | Freq: Every day | ORAL | Status: DC
  Administered 2012-12-18 – 2012-12-19 (×2): 100 mg via ORAL
  Filled 2012-12-17 (×2): qty 1

## 2012-12-17 MED ORDER — HYDROCODONE-ACETAMINOPHEN 5-325 MG PO TABS
1.0000 | ORAL_TABLET | Freq: Four times a day (QID) | ORAL | Status: DC | PRN
  Administered 2012-12-17: 1 via ORAL
  Filled 2012-12-17: qty 1

## 2012-12-17 MED ORDER — LACTATED RINGERS IV SOLN: INTRAVENOUS | Status: DC

## 2012-12-17 MED ORDER — CITRIC ACID-SODIUM CITRATE 334-500 MG/5ML PO SOLN: 30.0000 mL | Freq: Once | ORAL | Status: AC

## 2012-12-17 MED ORDER — DOCUSATE SODIUM 100 MG PO CAPS: 100.0000 mg | ORAL_CAPSULE | Freq: Every day | ORAL | Status: DC

## 2012-12-17 MED ORDER — ONDANSETRON 4 MG PO TBDP
4.0000 mg | ORAL_TABLET | Freq: Four times a day (QID) | ORAL | Status: DC | PRN
  Filled 2012-12-17: qty 1

## 2012-12-17 NOTE — Progress Notes (Signed)
Pt taking tylenol this am for fever, continues to have vomiting, headaches, diarrhea. Is able to drink water. Temp to 101.0 at home last night. On Macrobid since Tuesday for UTI . Culture Staph aureus, S to all agents, allegedly including Nitrofurantion.  2+ rt cvat.  Abd soft  Good FM,

## 2012-12-17 NOTE — Telephone Encounter (Signed)
Pt states continues to have a fever. Pt told to come in at 10:30 am to see Dr. Emelda Fear.

## 2012-12-17 NOTE — H&P (Signed)
Autumn Herrera is a 27 y.o. [id number] at [redacted]w[redacted]d admitted for pyelonephritis.   0 Fetal presentation is unsure.  History of Present Illness: Pt started having symptoms of UTI on Monday (4 days ago). Was started on an antibiotic. Was worse on Wednesday - fever to 101 deg F, vomiting, R flank pain. Switched antibiotics. Dr. Emelda Fear called her today and told her to come to hospital.  Still having fever/chills, R flank pain, achy all over, urinary frequency. Nausea is better and was able to keep a sandwich down today.  Receives care at [facility], no complications of pregnancy. Two prior c-sections.   Patient reports the fetal movement as active. Patient reports uterine contraction  activity as none (mild occasional cramping). Patient reports  vaginal bleeding as none. Patient describes fluid per vagina as None.  Patient Active Problem List   Diagnosis Date Noted  . History of cesarean delivery, currently pregnant 11/08/2012   Past Medical History: Past Medical History  Diagnosis Date  . Degenerative disc disease, lumbar   . Disc herniation   . Pregnant   . Asthma   . Depression   . DM (diabetes mellitus), gestational 2006    on insulin  . History of cesarean delivery, currently pregnant 11/08/2012  . Gestational diabetes     Past Surgical History: Past Surgical History  Procedure Laterality Date  . Knee surgery    . Cesarean section    . Dilation and curettage of uterus N/A 2010    Obstetrical History: OB History   Grav Para Term Preterm Abortions TAB SAB Ect Mult Living   4 2 2  1  1   2       Social History: History   Social History  . Marital Status: Married    Spouse Name: N/A    Number of Children: N/A  . Years of Education: N/A   Social History Main Topics  . Smoking status: Current Every Day Smoker -- 1.00 packs/day    Types: Cigarettes  . Smokeless tobacco: Not on file  . Alcohol Use: No  . Drug Use: No  . Sexually Active: Yes   Other Topics  Concern  . Not on file   Social History Narrative  . No narrative on file    Family History: Family History  Problem Relation Age of Onset  . Diabetes Other   . Hypertension Other   . Stroke Other   . Cancer Other     Leukemia and Breast   . CAD Other     Allergies: Allergies  Allergen Reactions  . Penicillins Rash  . Latex Swelling and Rash  . Pitocin (Oxytocin) Other (See Comments)    Passed out when given before.    Prescriptions prior to admission  Medication Sig Dispense Refill  . acetaminophen (TYLENOL) 325 MG tablet Take by mouth every 4 (four) hours as needed for pain.      . calcium carbonate (TUMS EX) 750 MG chewable tablet Chew 1 tablet by mouth 3 (three) times daily.      . diphenhydrAMINE (BENADRYL) 25 MG tablet Take 25 mg by mouth at bedtime as needed for itching or allergies.      Marland Kitchen guaiFENesin (ROBITUSSIN) 100 MG/5ML liquid Take 200 mg by mouth 3 (three) times daily as needed for cough.      Marland Kitchen HYDROcodone-acetaminophen (NORCO/VICODIN) 5-325 MG per tablet TAKE 1 TABLET EVERY 6 HOURS AS NEEDED FOR PAIN  30 tablet  0  . nitrofurantoin, macrocrystal-monohydrate, (MACROBID) 100  MG capsule Take 1 capsule (100 mg total) by mouth 2 (two) times daily.  14 capsule  0  . Prenatal Vit-Fe Fumarate-FA (PRENATAL MULTIVITAMIN) TABS Take 1 tablet by mouth every morning.        Review of Systems - pertinent pos and neg stated in HPI  Vitals:  LMP 04/12/2012 Physical Examination: Gen:  Alert, no distress  CV:  RRR , no murmur RESP:  CTAB Abdomen: gravid and R CVA tenderness, no other tenderness  Extremities: extremities normal, atraumatic, no cyanosis or edema  Membranes:intact Fetal Monitoring:Baseline: 140-150 bpm, Variability: Good {> 6 bpm), Accelerations: Reactive and Decelerations: Absent Labs:  Results for orders placed in visit on 12/17/12 (from the past 24 hour(s))  POCT URINALYSIS DIPSTICK   Collection Time    12/17/12 10:34 AM      Result Value Range    Color, UA yellow     Clarity, UA clear     Glucose, UA neg     Bilirubin, UA       Ketones, UA small     Spec Grav, UA       Blood, UA trace     pH, UA       Protein, UA 1     Urobilinogen, UA       Nitrite, UA neg     Leukocytes, UA Negative      Imaging Studies: none   No current facility-administered medications on file prior to encounter.   Current Outpatient Prescriptions on File Prior to Encounter  Medication Sig Dispense Refill  . acetaminophen (TYLENOL) 325 MG tablet Take by mouth every 4 (four) hours as needed for pain.      . calcium carbonate (TUMS EX) 750 MG chewable tablet Chew 1 tablet by mouth 3 (three) times daily.      . diphenhydrAMINE (BENADRYL) 25 MG tablet Take 25 mg by mouth at bedtime as needed for itching or allergies.      Marland Kitchen guaiFENesin (ROBITUSSIN) 100 MG/5ML liquid Take 200 mg by mouth 3 (three) times daily as needed for cough.      Marland Kitchen HYDROcodone-acetaminophen (NORCO/VICODIN) 5-325 MG per tablet TAKE 1 TABLET EVERY 6 HOURS AS NEEDED FOR PAIN  30 tablet  0  . nitrofurantoin, macrocrystal-monohydrate, (MACROBID) 100 MG capsule Take 1 capsule (100 mg total) by mouth 2 (two) times daily.  14 capsule  0  . Prenatal Vit-Fe Fumarate-FA (PRENATAL MULTIVITAMIN) TABS Take 1 tablet by mouth every morning.        I have reviewed the patient's current medications.  ASSESSMENT/PLAN: 27 y.o. [id number] at [redacted]w[redacted]d with  - Pyelonephritis, failed outpatient treatment:  Rocephin, tylenol, zofran, vicodin for pain. - 2 prior c-sections - already scheduled for repeat c-section - IUP: no signs PTL, monitor q shift - Likely switch to PO antibiotics when afebrile x 24 hours  Napoleon Form, MD

## 2012-12-17 NOTE — Patient Instructions (Addendum)
To be admitted to [facility] hospital

## 2012-12-17 NOTE — H&P (Signed)
Autumn Herrera is a 27 y.o. female presenting for admission for treatment for pyelonephritis. She has had a urinary tract infection diagnosed 12/13/2012, with culture that has returned positive for staph aureus sensitive to all antibiotics, treated with Macrodantin. Unfortunately the patient had progression of symptoms with GI upset on Wednesday, and last night developed fever at home with temperature to 101, and temperature this morning in the office of 99.2. She has mild right CVA tenderness. Has had some GI discomfort but is tolerating fluids and solids. She has a history of penicillin allergy that she reports was a rash, and that she think she can take cephalosporins, as she's had no other associated rashes. A review of the record shows that her allergy was recorded in epic as being anaphylaxis, and after discussing with patient change that notation in the record She will be admitted for antibiotic regimen, Ancef, with anticipated brief hospitalization most probably 2 days orders are written. Dr. Eber Jones Herrera has been notified of the admission History OB History   Grav Para Term Preterm Abortions TAB SAB Ect Mult Living   4 2 2  1  1   2      Past Medical History  Diagnosis Date  . Degenerative disc disease, lumbar   . Disc herniation   . Pregnant   . Asthma   . Depression   . DM (diabetes mellitus), gestational 2006    on insulin  . History of cesarean delivery, currently pregnant 11/08/2012  . Gestational diabetes    Past Surgical History  Procedure Laterality Date  . Knee surgery    . Cesarean section    . Dilation and curettage of uterus N/A 2010   Family History: family history includes CAD in her other; Cancer in her other; Diabetes in her other; Hypertension in her other; and Stroke in her other. Social History:  reports that she has been smoking Cigarettes.  She has been smoking about 1.00 pack per day. She does not have any smokeless tobacco history on file. She reports  that she does not drink alcohol or use illicit drugs.   Prenatal Transfer Tool  [facility] unit tract infection diagnosed 12th, nausea and vomiting yesterday and Wednesday, with fever last night    Blood pressure 100/70, temperature 99.1 F (37.3 C), weight 127 lb (57.607 kg), last menstrual period 04/12/2012. Exam Physical Exam  Physical Examination: General appearance - alert, well appearing, and in no distress, well hydrated and in mild to moderate distress Mental status - alert, oriented to person, place, and time, normal mood, behavior, speech, dress, motor activity, and thought processes Chest - clear to auscultation, no wheezes, rales or rhonchi, symmetric air entry Abdomen - soft, nontender, nondistended, no masses or organomegaly Bowel sounds present, normal no guarding or rebound. Back exam - mild CVA tenderness on the right to percussion Urinalysis in the office today shows 1+ protein small I. ketones and trace blood. Culture will be repeated Prenatal labs: ABO, Rh: --/--/A POS (11/02 1440) Antibody: NEG (03/28 0943) Rubella: Immune (10/21 0000) RPR: NON REAC (03/28 0943)  HBsAg: Negative (10/21 0000)  HIV: NON REACTIVE (03/28 0943)  GBS:     Assessment/Plan: Early pyelonephritis, as after failed treatment with Macrodantin for staph aureus UTI Penicillin allergy, reported as mild by palpation conversation Plan admit for IV Ancef 2 g loading dose, 1 every 6    Autumn Herrera 12/17/2012, 12:35 PM

## 2012-12-17 NOTE — H&P (Signed)
Attestation of Attending Supervision of Obstetric Fellow: Evaluation and management procedures were performed by the Obstetric Fellow under my supervision and collaboration.  I have reviewed the Obstetric Fellow's note and chart, and I agree with the management and plan.  UGONNA  ANYANWU, MD, FACOG Attending Obstetrician & Gynecologist Faculty Practice, [facility]   

## 2012-12-17 NOTE — Progress Notes (Signed)
Iv attempts x 3 and then successful on the 4th.  First was left hand, then left forearm, then right forearm.

## 2012-12-18 LAB — URINE CULTURE
Colony Count: NO GROWTH
Culture: NO GROWTH

## 2012-12-18 MED ORDER — PHENYLEPHRINE 40 MCG/ML (10ML) SYRINGE FOR IV PUSH (FOR BLOOD PRESSURE SUPPORT)
PREFILLED_SYRINGE | INTRAVENOUS | Status: AC
  Filled 2012-12-18: qty 5

## 2012-12-18 MED ORDER — FENTANYL CITRATE 0.05 MG/ML IJ SOLN
INTRAMUSCULAR | Status: AC
  Filled 2012-12-18: qty 2

## 2012-12-18 MED ORDER — MORPHINE SULFATE 0.5 MG/ML IJ SOLN
INTRAMUSCULAR | Status: AC
  Filled 2012-12-18: qty 10

## 2012-12-18 MED ORDER — EPHEDRINE 5 MG/ML INJ
INTRAVENOUS | Status: AC
  Filled 2012-12-18: qty 10

## 2012-12-18 MED ORDER — ONDANSETRON HCL 4 MG/2ML IJ SOLN
INTRAMUSCULAR | Status: AC
  Filled 2012-12-18: qty 2

## 2012-12-18 MED ORDER — OXYTOCIN 10 UNIT/ML IJ SOLN
INTRAMUSCULAR | Status: AC
  Filled 2012-12-18: qty 4

## 2012-12-18 MED ORDER — LACTATED RINGERS IV BOLUS (SEPSIS): 500.0000 mL | Freq: Once | INTRAVENOUS | Status: DC

## 2012-12-18 NOTE — Progress Notes (Signed)
Patient ID: Autumn Herrera, female   DOB: 12/24/1985, 27 y.o.   MRN: [id number] FACULTY PRACTICE ANTEPARTUM(COMPREHENSIVE) NOTE  TOMASINA KEASLING is a 27 y.o. [id number] with Estimated Date of Delivery: 01/17/13   By  LMP, early ultrasound [redacted]w[redacted]d  who is admitted for pyelonephritis.    Fetal presentation is cephalic. Length of Stay:  1  Days  Date of admission:12/17/2012  Subjective: Feels better, back hurts less but still hurts, no fever overnight Patient reports the fetal movement as active. Patient reports uterine contraction  activity as none. Patient reports  vaginal bleeding as none. Patient describes fluid per vagina as None.  Vitals:  Blood pressure 103/61, pulse 89, temperature 98.7 F (37.1 C), temperature source Oral, resp. rate 18, height 5\' 4"  (1.626 m), weight 127 lb (57.607 kg), last menstrual period 04/12/2012. Filed Vitals:   12/17/12 1856 12/17/12 1935 12/17/12 2342 12/18/12 0338  BP:  95/58 103/62 103/61  Pulse:  94 91 89  Temp:  97.8 F (36.6 C) 98.5 F (36.9 C) 98.7 F (37.1 C)  TempSrc:  Oral Oral   Resp: 18 19 18 18   Height:      Weight:       Physical Examination:  General appearance - alert, well appearing, and in no distress Back exam - +CVAT moderate Fundal Height:  size equals dates Fetal Monitoring:  Baseline: 140 bpm, Variability: Good {> 6 bpm), Accelerations: Reactive and Decelerations: Absent   reactive  Labs:  Results for orders placed during the hospital encounter of 12/17/12 (from the past 24 hour(s))  CBC WITH DIFFERENTIAL   Collection Time    12/17/12  1:45 PM      Result Value Range   WBC 20.7 (*) 4.0 - 10.5 K/uL   RBC 3.65 (*) 3.87 - 5.11 MIL/uL   Hemoglobin 10.6 (*) 12.0 - 15.0 g/dL   HCT 14.7 (*) 82.9 - 56.2 %   MCV 86.3  78.0 - 100.0 fL   MCH 29.0  26.0 - 34.0 pg   MCHC 33.7  30.0 - 36.0 g/dL   RDW 13.0  86.5 - 78.4 %   Platelets 222  150 - 400 K/uL   Neutrophils Relative % 83 (*) 43 - 77 %   Neutro Abs 17.2 (*) 1.7 - 7.7  K/uL   Lymphocytes Relative 9 (*) 12 - 46 %   Lymphs Abs 1.8  0.7 - 4.0 K/uL   Monocytes Relative 8  3 - 12 %   Monocytes Absolute 1.6 (*) 0.1 - 1.0 K/uL   Eosinophils Relative 0  0 - 5 %   Eosinophils Absolute 0.0  0.0 - 0.7 K/uL   Basophils Relative 0  0 - 1 %   Basophils Absolute 0.0  0.0 - 0.1 K/uL  ABO/RH   Collection Time    12/17/12  3:05 PM      Result Value Range   ABO/RH(D) A POS    TYPE AND SCREEN   Collection Time    12/17/12  3:09 PM      Result Value Range   ABO/RH(D) A POS     Antibody Screen NEG     Sample Expiration 12/20/2012    POCT URINALYSIS DIPSTICK   Collection Time    12/17/12 10:34 AM      Result Value Range   Color, UA yellow     Clarity, UA clear     Glucose, UA neg     Bilirubin, UA       Ketones,  UA small     Spec Grav, UA       Blood, UA trace     pH, UA       Protein, UA 1     Urobilinogen, UA       Nitrite, UA neg     Leukocytes, UA Negative      Imaging Studies:      Medications:  Scheduled . cefTRIAXone (ROCEPHIN)  IV  1 g Intravenous Q12H  . docusate sodium  100 mg Oral QHS  . prenatal multivitamin  1 tablet Oral QHS   I have reviewed the patient's current medications.  ASSESSMENT: [redacted]w[redacted]d Pyelonephritis Patient Active Problem List   Diagnosis Date Noted  . History of cesarean delivery, currently pregnant 11/08/2012    PLAN: Cont IV rocephin until CVAT resolves, probably 36-48 more hours  EURE,LUTHER H 12/18/2012,7:04 AM

## 2012-12-19 DIAGNOSIS — O2390 Unspecified genitourinary tract infection in pregnancy, unspecified trimester: Principal | ICD-10-CM

## 2012-12-19 MED ORDER — HYDROCOD POLST-CHLORPHEN POLST 10-8 MG/5ML PO LQCR
5.0000 mL | Freq: Two times a day (BID) | ORAL | Status: DC
  Administered 2012-12-19 (×2): 5 mL via ORAL
  Filled 2012-12-19 (×2): qty 5

## 2012-12-19 MED ORDER — SODIUM CHLORIDE 0.9 % IJ SOLN
3.0000 mL | Freq: Two times a day (BID) | INTRAMUSCULAR | Status: DC
  Administered 2012-12-19 (×2): 3 mL via INTRAVENOUS

## 2012-12-19 MED ORDER — GUAIFENESIN 100 MG/5ML PO SOLN
15.0000 mL | ORAL | Status: DC | PRN
  Administered 2012-12-19 (×3): 300 mg via ORAL
  Filled 2012-12-19 (×3): qty 15

## 2012-12-19 MED ORDER — HYDROCODONE-HOMATROPINE 5-1.5 MG/5ML PO SYRP: 5.0000 mL | ORAL_SOLUTION | Freq: Once | ORAL | Status: DC

## 2012-12-19 NOTE — Progress Notes (Signed)
Patient ID: Autumn Herrera, female   DOB: Aug 21, 1985, 27 y.o.   MRN: [id number] ACULTY PRACTICE ANTEPARTUM COMPREHENSIVE PROGRESS NOTE  CHERYAL SALAS is a 27 y.o. [id number] at [redacted]w[redacted]d  who is admitted for pyelonephritis.   Fetal presentation is unsure. Length of Stay:  2  Days  Subjective: Pt reports that she felt better yesterday.  Today she c/o cough and back pain that is worse from yesterday but, improved from admission. Patient reports good fetal movement.  She reports no uterine contractions, no bleeding and no loss of fluid per vagina.  Vitals:  Blood pressure 111/75, pulse 85, temperature 97.9 F (36.6 C), temperature source Oral, resp. rate 18, height 5\' 4"  (1.626 m), weight 127 lb (57.607 kg), last menstrual period 04/12/2012. Physical Examination: General appearance - oriented to person, place, and time and in mild to moderate distress Chest - clear to auscultation, no wheezes, rales or rhonchi, symmetric air entry Heart - normal rate, regular rhythm, normal S1, S2, no murmurs, rubs, clicks or gallops Abdomen - tenderness noted diffusely.  Not uterine no rebound tenderness noted gravid Back exam - full range of motion, no tenderness, palpable spasm or pain on motion, CVAT noted R>L Extremities - peripheral pulses normal, no pedal edema, no clubbing or cyanosis, no pedal edema noted Cervical Exam: Not evaluated.  Fetal Monitoring:  Baseline: 130 bpm, Variability: Good {> 6 bpm) and Accelerations: Reactive  Labs:  No results found for this or any previous visit (from the past 24 hour(s)).  Imaging Studies:    n/a   Medications:  Scheduled . cefTRIAXone (ROCEPHIN)  IV  1 g Intravenous Q12H  . docusate sodium  100 mg Oral QHS  . prenatal multivitamin  1 tablet Oral QHS   I have reviewed the patient's current medications.  ASSESSMENT: Patient Active Problem List   Diagnosis Date Noted  . History of cesarean delivery, currently pregnant 11/08/2012    PLAN: Keep IV  Atbx Continue routine antenatal care.   HARRAWAY-SMITH, Sabastian Raimondi 12/19/2012,8:40 AM

## 2012-12-20 ENCOUNTER — Encounter: Payer: Medicaid Other | Admitting: Obstetrics and Gynecology

## 2012-12-20 MED ORDER — CEPHALEXIN 500 MG PO CAPS: 500.0000 mg | ORAL_CAPSULE | Freq: Four times a day (QID) | ORAL | Status: DC

## 2012-12-20 MED ORDER — CEPHALEXIN 500 MG PO CAPS
500.0000 mg | ORAL_CAPSULE | Freq: Four times a day (QID) | ORAL | Status: DC
  Administered 2012-12-20: 500 mg via ORAL
  Filled 2012-12-20: qty 1

## 2012-12-20 NOTE — Discharge Summary (Signed)
Antenatal Physician Discharge Summary  Patient ID: Autumn Herrera MRN: [id number] DOB/AGE: 1986-03-27 27 y.o.  Admit date: 12/17/2012 Discharge date: 12/20/2012  Admission Diagnoses:  IUP at [redacted]w[redacted]d, pyelonephritis with failed outpatient treatment  Discharge Diagnoses:  IUP at [redacted]w[redacted]d, pyelonephritis  Prenatal Procedures: NST   Significant Diagnostic Studies:  Results for orders placed during the hospital encounter of 12/17/12 (from the past 168 hour(s))  CBC WITH DIFFERENTIAL   Collection Time    12/17/12  1:45 PM      Result Value Range   WBC 20.7 (*) 4.0 - 10.5 K/uL   RBC 3.65 (*) 3.87 - 5.11 MIL/uL   Hemoglobin 10.6 (*) 12.0 - 15.0 g/dL   HCT 13.0 (*) 86.5 - 78.4 %   MCV 86.3  78.0 - 100.0 fL   MCH 29.0  26.0 - 34.0 pg   MCHC 33.7  30.0 - 36.0 g/dL   RDW 69.6  29.5 - 28.4 %   Platelets 222  150 - 400 K/uL   Neutrophils Relative % 83 (*) 43 - 77 %   Neutro Abs 17.2 (*) 1.7 - 7.7 K/uL   Lymphocytes Relative 9 (*) 12 - 46 %   Lymphs Abs 1.8  0.7 - 4.0 K/uL   Monocytes Relative 8  3 - 12 %   Monocytes Absolute 1.6 (*) 0.1 - 1.0 K/uL   Eosinophils Relative 0  0 - 5 %   Eosinophils Absolute 0.0  0.0 - 0.7 K/uL   Basophils Relative 0  0 - 1 %   Basophils Absolute 0.0  0.0 - 0.1 K/uL  ABO/RH   Collection Time    12/17/12  3:05 PM      Result Value Range   ABO/RH(D) A POS    TYPE AND SCREEN   Collection Time    12/17/12  3:09 PM      Result Value Range   ABO/RH(D) A POS     Antibody Screen NEG     Sample Expiration 12/20/2012    URINE CULTURE   Collection Time    12/17/12  5:31 PM      Result Value Range   Specimen Description OB CLEAN CATCH     Special Requests NONE     Culture  Setup Time 12/17/2012 23:53     Colony Count NO GROWTH     Culture NO GROWTH     Report Status 12/18/2012 FINAL    POCT URINALYSIS DIPSTICK   Collection Time    12/17/12 10:34 AM      Result Value Range   Color, UA yellow     Clarity, UA clear     Glucose, UA neg     Bilirubin,  UA       Ketones, UA small     Spec Grav, UA       Blood, UA trace     pH, UA       Protein, UA 1     Urobilinogen, UA       Nitrite, UA neg     Leukocytes, UA Negative    Results for orders placed in visit on 12/15/12 (from the past 168 hour(s))  POCT URINALYSIS DIPSTICK   Collection Time    12/15/12  2:19 PM      Result Value Range   Color, UA       Clarity, UA       Glucose, UA neg     Bilirubin, UA       Ketones, UA  neg     Spec Grav, UA       Blood, UA neg     pH, UA       Protein, UA trace     Urobilinogen, UA       Nitrite, UA neg     Leukocytes, UA Negative    Results for orders placed in visit on 12/13/12 (from the past 168 hour(s))  POCT URINALYSIS DIPSTICK   Collection Time    12/13/12 10:03 AM      Result Value Range   Color, UA amber     Clarity, UA clear     Glucose, UA neg     Bilirubin, UA       Ketones, UA neg     Spec Grav, UA       Blood, UA trace     pH, UA       Protein, UA trace     Urobilinogen, UA       Nitrite, UA neg     Leukocytes, UA small (1+)    URINE CULTURE   Collection Time    12/13/12 10:50 AM      Result Value Range   Culture STAPHYLOCOCCUS SPECIES (COAGULASE NEGATIVE)     Colony Count >=100,000 COLONIES/ML     Organism ID, Bacteria STAPHYLOCOCCUS SPECIES (COAGULASE NEGATIVE)    URINALYSIS, ROUTINE W REFLEX MICROSCOPIC   Collection Time    12/13/12 10:50 AM      Result Value Range   Color, Urine YELLOW  YELLOW   APPearance CLEAR  CLEAR   Specific Gravity, Urine 1.023  1.005 - 1.030   pH 7.0  5.0 - 8.0   Glucose, UA NEG  NEG mg/dL   Bilirubin Urine NEG  NEG   Ketones, ur TRACE (*) NEG mg/dL   Hgb urine dipstick NEG  NEG   Protein, ur 30 (*) NEG mg/dL   Urobilinogen, UA 1  0.0 - 1.0 mg/dL   Nitrite POS (*) NEG   Leukocytes, UA SMALL (*) NEG  URINALYSIS, MICROSCOPIC ONLY   Collection Time    12/13/12 10:50 AM      Result Value Range   Squamous Epithelial / LPF MANY (*) RARE   Crystals NONE SEEN  NONE SEEN   Casts  NONE SEEN  NONE SEEN   WBC, UA 21-50 (*) <3 WBC/hpf   RBC / HPF 0-2  <3 RBC/hpf   Bacteria, UA NONE SEEN  RARE    Treatments: Rocephin, IV fluids, pain control  Hospital Course:  This is a 27 y.o. [id number] with IUP at [redacted]w[redacted]d admitted for pyelonephritis. She was treated with Ceftriaxone for 3 days and transitioned to Keflex for discharge. She remained afebrile. By the day of discharge she was tolerating PO, voiding well and stable for discharge home. She continued to complain of right flank pain.  She was observed, fetal heart rate monitoring remained reassuring, and she had no signs/symptoms of preterm labor or other maternal-fetal concerns.  Discharge Exam: BP 124/80  Pulse 103  Temp(Src) 97.7 F (36.5 C) (Oral)  Resp 18  Ht 5\' 4"  (1.626 m)  Wt 57.607 kg (127 lb)  BMI 21.79 kg/m2  SpO2 95%  LMP 04/12/2012  GEN:  WNWD, no distress HEENT:  NCAT, EOMI, conjunctiva clear NECK:  Supple, non-tender, no thyromegaly, trachea midline CV: RRR, no murmur RESP:  CTAB ABD:  Soft, no guarding or rebound, normal bowel sounds, mild right CVA tenderness EXTREM:  Warm, well perfused, no edema or tenderness  NEURO:  Alert, oriented, no focal deficits  Discharge Condition: stable  Disposition: Discharged home to self-care  Discharge Orders   Future Appointments Provider Department Dept Phone   12/21/2012 11:15 AM Jacklyn Shell, CNM [facility] [phone]   01/10/2013 9:30 AM [facility] Dennie Bible 1 [facility] [phone]   Future Orders Complete By Expires     Discharge patient  As directed     Comments:      To home        Medication List    STOP taking these medications       acetaminophen 325 MG tablet  Commonly known as:  TYLENOL     nitrofurantoin (macrocrystal-monohydrate) 100 MG capsule  Commonly known as:  MACROBID      TAKE these medications       calcium carbonate 750 MG chewable tablet  Commonly known as:  TUMS EX   Chew 1 tablet by mouth 3 (three) times daily.     cephALEXin 500 MG capsule  Commonly known as:  KEFLEX  Take 1 capsule (500 mg total) by mouth 4 (four) times daily.     diphenhydrAMINE 25 MG tablet  Commonly known as:  BENADRYL  Take 25 mg by mouth at bedtime as needed for itching or allergies.     guaiFENesin 100 MG/5ML liquid  Commonly known as:  ROBITUSSIN  Take 200 mg by mouth 3 (three) times daily as needed for cough.     HYDROcodone-acetaminophen 5-325 MG per tablet  Commonly known as:  NORCO/VICODIN  TAKE 1 TABLET EVERY 6 HOURS AS NEEDED FOR PAIN     prenatal multivitamin Tabs  Take 1 tablet by mouth every morning.        Signed: Napoleon Form M.D. 12/20/2012, 7:30 AM

## 2012-12-20 NOTE — Progress Notes (Signed)
Post discharge review completed. 

## 2012-12-21 ENCOUNTER — Encounter: Payer: Self-pay | Admitting: Advanced Practice Midwife

## 2012-12-21 ENCOUNTER — Other Ambulatory Visit: Payer: Self-pay | Admitting: Advanced Practice Midwife

## 2012-12-21 ENCOUNTER — Ambulatory Visit (INDEPENDENT_AMBULATORY_CARE_PROVIDER_SITE_OTHER): Payer: Medicaid Other | Admitting: Advanced Practice Midwife

## 2012-12-21 VITALS — BP 110/74 | Wt 135.0 lb

## 2012-12-21 DIAGNOSIS — Z3483 Encounter for supervision of other normal pregnancy, third trimester: Secondary | ICD-10-CM

## 2012-12-21 DIAGNOSIS — Z3480 Encounter for supervision of other normal pregnancy, unspecified trimester: Secondary | ICD-10-CM | POA: Insufficient documentation

## 2012-12-21 DIAGNOSIS — Z331 Pregnant state, incidental: Secondary | ICD-10-CM

## 2012-12-21 DIAGNOSIS — Z1389 Encounter for screening for other disorder: Secondary | ICD-10-CM

## 2012-12-21 DIAGNOSIS — O2390 Unspecified genitourinary tract infection in pregnancy, unspecified trimester: Secondary | ICD-10-CM

## 2012-12-21 LAB — POCT URINALYSIS DIPSTICK
Blood, UA: NEGATIVE
Glucose, UA: NEGATIVE
Ketones, UA: NEGATIVE
Leukocytes, UA: NEGATIVE
Nitrite, UA: NEGATIVE
Protein, UA: NEGATIVE

## 2012-12-21 NOTE — Progress Notes (Addendum)
Just released from [facility] after treatment for pyelonephritis. Feels much bettetr.    No c/o at this time.  Routine questions about pregnancy answered.  F/U in 1 weeks for LROB.  Has been on hydrocodone for round ligament pain since the beginning of April. (Has been taking it q 6 hours).  Pt advised against long term narcotic use at this stage in the pregnancy (neonatal withdrawal issues:  Baby will have to go to NICU for withdrawal protocol; sensitization to meds with a c/section coming up, high potential for addiction herself).  Pt agrees that she should not be taking narcotics until her c/section.  GBS/GC/CHL done today.

## 2012-12-21 NOTE — Progress Notes (Signed)
Bilateral swelling in legs and feet, Pt admitted to [facility] on Friday, May 16,2014 for bladder infection treated with IV antibiotics, released yesterday. Pt states feeling better.

## 2012-12-22 ENCOUNTER — Other Ambulatory Visit: Payer: Self-pay | Admitting: Obstetrics and Gynecology

## 2012-12-22 ENCOUNTER — Telehealth: Payer: Self-pay | Admitting: Obstetrics and Gynecology

## 2012-12-22 LAB — GC/CHLAMYDIA PROBE AMP: CT Probe RNA: NEGATIVE

## 2012-12-22 NOTE — Telephone Encounter (Signed)
Routed to Dr. Ferguson. JSY 

## 2012-12-23 ENCOUNTER — Other Ambulatory Visit: Payer: Self-pay | Admitting: Obstetrics and Gynecology

## 2012-12-23 NOTE — Telephone Encounter (Signed)
Declined to fill rx   Patient suspected to be opiate dependent. Can make appt with me. Has seen other providers.

## 2012-12-23 NOTE — Telephone Encounter (Signed)
Spoke with pt. Informed that Dr. Emelda Fear denied refill. Pt has appt scheduled with Dr. Emelda Fear next Thursday and will discuss it with him then. JSY

## 2012-12-23 NOTE — Progress Notes (Addendum)
Pt changed her mind about not taking narcotics and requested refill of Vicodin.  Referral made to Neonatal Abstinence Team.  11:36 AM 12/23/2012

## 2012-12-24 LAB — CULTURE, BETA STREP (GROUP B ONLY)

## 2012-12-28 ENCOUNTER — Ambulatory Visit (INDEPENDENT_AMBULATORY_CARE_PROVIDER_SITE_OTHER): Payer: Medicaid Other | Admitting: Obstetrics and Gynecology

## 2012-12-28 ENCOUNTER — Telehealth: Payer: Self-pay | Admitting: Obstetrics and Gynecology

## 2012-12-28 VITALS — BP 108/76 | Temp 97.8°F | Wt 123.6 lb

## 2012-12-28 DIAGNOSIS — O2390 Unspecified genitourinary tract infection in pregnancy, unspecified trimester: Secondary | ICD-10-CM

## 2012-12-28 DIAGNOSIS — Z331 Pregnant state, incidental: Secondary | ICD-10-CM

## 2012-12-28 DIAGNOSIS — O2303 Infections of kidney in pregnancy, third trimester: Secondary | ICD-10-CM | POA: Insufficient documentation

## 2012-12-28 DIAGNOSIS — Z1389 Encounter for screening for other disorder: Secondary | ICD-10-CM

## 2012-12-28 DIAGNOSIS — O99019 Anemia complicating pregnancy, unspecified trimester: Secondary | ICD-10-CM

## 2012-12-28 DIAGNOSIS — R300 Dysuria: Secondary | ICD-10-CM

## 2012-12-28 LAB — POCT URINALYSIS DIPSTICK
Blood, UA: NEGATIVE
Ketones, UA: NEGATIVE
Protein, UA: NEGATIVE

## 2012-12-28 MED ORDER — CEPHALEXIN 500 MG PO CAPS: 500.0000 mg | ORAL_CAPSULE | Freq: Four times a day (QID) | ORAL | Status: DC

## 2012-12-28 NOTE — Telephone Encounter (Signed)
Spoke with pt. Was in hospital last week for kidney infection. Symptoms are coming back. Having bladder/kidney pain, and vomiting. Started back yesterday. Finished antibiotic Saturday. appt scheduled to come in this afternoon and see Dr. Emelda Fear.

## 2012-12-28 NOTE — Progress Notes (Signed)
C/o urinary frequency, burning, n/v, and fever to 100. CVAT aching again. Right side. Urine entirely negative. \ Weight loss during hospitalization , restarted this a.m.  PEX: absolutely normal appearing person, nontoxic, vague complaints of low back pain on right moving up right side of back. Will followup ;urine c& S., Rx'd keflex as precaution , will d/c if culture negative.

## 2012-12-28 NOTE — Discharge Summary (Signed)
Agree with above note.  Autumn Herrera H. 12/28/2012 1:48 PM

## 2012-12-29 LAB — URINALYSIS, ROUTINE W REFLEX MICROSCOPIC
Bilirubin Urine: NEGATIVE
Glucose, UA: NEGATIVE mg/dL
Hgb urine dipstick: NEGATIVE
Leukocytes, UA: NEGATIVE
Protein, ur: NEGATIVE mg/dL
pH: 7 (ref 5.0–8.0)

## 2012-12-30 ENCOUNTER — Ambulatory Visit (INDEPENDENT_AMBULATORY_CARE_PROVIDER_SITE_OTHER): Payer: Medicaid Other | Admitting: Obstetrics and Gynecology

## 2012-12-30 ENCOUNTER — Encounter (HOSPITAL_COMMUNITY): Payer: Self-pay | Admitting: Pharmacy Technician

## 2012-12-30 VITALS — BP 116/80 | Wt 122.8 lb

## 2012-12-30 DIAGNOSIS — O99330 Smoking (tobacco) complicating pregnancy, unspecified trimester: Secondary | ICD-10-CM

## 2012-12-30 DIAGNOSIS — Z8632 Personal history of gestational diabetes: Secondary | ICD-10-CM

## 2012-12-30 DIAGNOSIS — Z1389 Encounter for screening for other disorder: Secondary | ICD-10-CM

## 2012-12-30 DIAGNOSIS — O34219 Maternal care for unspecified type scar from previous cesarean delivery: Secondary | ICD-10-CM

## 2012-12-30 DIAGNOSIS — Z331 Pregnant state, incidental: Secondary | ICD-10-CM

## 2012-12-30 DIAGNOSIS — Z3493 Encounter for supervision of normal pregnancy, unspecified, third trimester: Secondary | ICD-10-CM

## 2012-12-30 DIAGNOSIS — O09299 Supervision of pregnancy with other poor reproductive or obstetric history, unspecified trimester: Secondary | ICD-10-CM

## 2012-12-30 LAB — POCT URINALYSIS DIPSTICK
Glucose, UA: NEGATIVE
Ketones, UA: NEGATIVE
Leukocytes, UA: NEGATIVE

## 2012-12-30 LAB — URINE CULTURE: Colony Count: NO GROWTH

## 2012-12-30 NOTE — Progress Notes (Signed)
Urine C&S (5/27) NO GROWTH. No fever, feels well. Increased fluids, no labor sx. Good FM, doing well off all pain meds, using some tylenol tid Rpt C/S scheduled 6/10

## 2013-01-05 ENCOUNTER — Inpatient Hospital Stay (HOSPITAL_COMMUNITY)
Admission: AD | Admit: 2013-01-05 | Discharge: 2013-01-05 | Disposition: A | Discharge status: Home / Self Care | Payer: Medicaid Other | Source: Ambulatory Visit | Attending: Obstetrics and Gynecology | Admitting: Obstetrics and Gynecology

## 2013-01-05 ENCOUNTER — Encounter (HOSPITAL_COMMUNITY): Payer: Self-pay | Admitting: Family

## 2013-01-05 DIAGNOSIS — IMO0002 Reserved for concepts with insufficient information to code with codable children: Secondary | ICD-10-CM

## 2013-01-05 DIAGNOSIS — N9489 Other specified conditions associated with female genital organs and menstrual cycle: Secondary | ICD-10-CM

## 2013-01-05 DIAGNOSIS — O34219 Maternal care for unspecified type scar from previous cesarean delivery: Secondary | ICD-10-CM

## 2013-01-05 DIAGNOSIS — O479 False labor, unspecified: Secondary | ICD-10-CM | POA: Insufficient documentation

## 2013-01-05 HISTORY — DX: Urinary tract infection, site not specified: N39.0

## 2013-01-05 LAB — URINE MICROSCOPIC-ADD ON

## 2013-01-05 LAB — URINALYSIS, ROUTINE W REFLEX MICROSCOPIC
Glucose, UA: NEGATIVE mg/dL
Ketones, ur: NEGATIVE mg/dL
Nitrite: NEGATIVE
Protein, ur: NEGATIVE mg/dL

## 2013-01-05 MED ORDER — HYDROCODONE-ACETAMINOPHEN 5-325 MG PO TABS: 1.0000 | ORAL_TABLET | Freq: Four times a day (QID) | ORAL | Status: DC | PRN

## 2013-01-05 MED ORDER — HYDROCODONE-ACETAMINOPHEN 5-325 MG PO TABS
1.0000 | ORAL_TABLET | Freq: Once | ORAL | Status: AC
  Administered 2013-01-05: 1 via ORAL
  Filled 2013-01-05: qty 1

## 2013-01-05 NOTE — MAU Provider Note (Signed)
History     CSN: [id number]  Arrival date and time: 01/05/13 1427   None     Chief Complaint  Patient presents with  . Labor Eval   HPI Comments: 27 y/o G4P2012 at 38.[redacted] weeks gestation presents with new onset of contractions at 3 AM.  Patient with h/o 2 prior C-sections.  Repeat C-section planned for Tuesday, June 10.  Patient denies any vaginal bleeding, vaginal discharge, or fluid leakage.  Patient reports good fetal movement.  Patient with h/o of UTIs in pregnancy.  Patient denies any dysuria, burning on urination, fever, or chills.  Patient states that she feels safe at home.  Patient previously on Hydrocodone for pain associated with UTIs.  She was weaned off Hydrocodone 1-2 weeks ago.  She states that she took 1 tablet of Hydrocodone earlier today.         Past Medical History  Diagnosis Date  . Degenerative disc disease, lumbar   . Disc herniation   . Pregnant   . Asthma   . Depression   . DM (diabetes mellitus), gestational 2006    on insulin  . History of cesarean delivery, currently pregnant 11/08/2012  . Gestational diabetes   . UTI (lower urinary tract infection)     Past Surgical History  Procedure Laterality Date  . Knee surgery    . Cesarean section    . Dilation and curettage of uterus N/A 2010    Family History  Problem Relation Age of Onset  . Diabetes Other   . Hypertension Other   . Stroke Other   . Cancer Other     Leukemia and Breast   . CAD Other     History  Substance Use Topics  . Smoking status: Current Every Day Smoker -- 1.00 packs/day    Types: Cigarettes  . Smokeless tobacco: Not on file  . Alcohol Use: No    Allergies:  Allergies  Allergen Reactions  . Penicillins Anaphylaxis and Rash    Able to take cephalosporins   . Latex Swelling and Rash  . Pitocin (Oxytocin) Other (See Comments)    Passed out when given before.    Prescriptions prior to admission  Medication Sig Dispense Refill  . calcium carbonate (TUMS EX)  750 MG chewable tablet Chew 1 tablet by mouth 2 (two) times daily as needed for heartburn.       Marland Kitchen HYDROcodone-acetaminophen (NORCO/VICODIN) 5-325 MG per tablet Take 1 tablet by mouth every 6 (six) hours as needed for pain.      . Prenatal Vit-Fe Fumarate-FA (PRENATAL MULTIVITAMIN) TABS Take 1 tablet by mouth daily.         Review of Systems  Constitutional: Negative.   Eyes: Negative.   Cardiovascular: Negative.   Genitourinary: Negative for dysuria.       Contractions  Skin: Negative.    Physical Exam   Blood pressure 111/72, pulse 95, temperature 98.4 F (36.9 C), temperature source Oral, resp. rate 16, last menstrual period 04/12/2012.  Physical Exam  Constitutional: She appears well-developed and well-nourished.  HENT:  Head: Normocephalic.  Cardiovascular: Normal rate, regular rhythm and normal heart sounds.   Respiratory: Effort normal and breath sounds normal.  GI: Soft. There is tenderness (suprapubic tenderness present).  Genitourinary: Vagina normal.  Cervix - Fingertip, Thick, and Posterior  FHT - Baseline 145 with good variability  Skin: Skin is warm, dry and intact.    MAU Course  Procedures    Assessment and Plan  26 y/o [id number]  at 38.[redacted] weeks gestation presents with new onset of contractions at 3 AM.  Patient evaluated on monitoring with contractions noted occasionally.  Pain improved with pain control administration.  Cervical exam unchanged, remained fingertip, thick, and posterior on recheck.  UA with trace leukocytes.  Will send for culture given h/o of UTIs and pyelo earlier in pregnancy.  Patient will be discharged home with PRN pain control.  Patient to f/u with OB provider on 6/5.  Patient counseled regarding signs/symptoms of labor and encouraged to return should experience these symptoms.  Harvie Heck 01/05/2013, 3:45 PM   I saw and examined patient and agree with above resident note. I reviewed history, imaging, labs, and vitals. I personally  reviewed the fetal heart tracing, and it is reactive. Napoleon Form, MD

## 2013-01-05 NOTE — MAU Note (Signed)
Patient presents to MAU with c/o contractions every 5 minutes since 0300 today; reports +FM; denies vaginal bleeding or LOF.  Patient is scheduled for repeat c-section on Tuesday 6/10.

## 2013-01-06 ENCOUNTER — Other Ambulatory Visit: Payer: Self-pay | Admitting: Obstetrics and Gynecology

## 2013-01-06 ENCOUNTER — Ambulatory Visit (INDEPENDENT_AMBULATORY_CARE_PROVIDER_SITE_OTHER): Payer: Medicaid Other | Admitting: Obstetrics and Gynecology

## 2013-01-06 ENCOUNTER — Inpatient Hospital Stay (HOSPITAL_COMMUNITY)
Admission: AD | Admit: 2013-01-06 | Discharge: 2013-01-06 | Disposition: A | Discharge status: Home / Self Care | Payer: Medicaid Other | Source: Ambulatory Visit | Attending: Obstetrics & Gynecology | Admitting: Obstetrics & Gynecology

## 2013-01-06 ENCOUNTER — Encounter (HOSPITAL_COMMUNITY): Payer: Self-pay | Admitting: *Deleted

## 2013-01-06 VITALS — BP 106/78 | Wt 128.4 lb

## 2013-01-06 DIAGNOSIS — Z3493 Encounter for supervision of normal pregnancy, unspecified, third trimester: Secondary | ICD-10-CM

## 2013-01-06 DIAGNOSIS — Z331 Pregnant state, incidental: Secondary | ICD-10-CM

## 2013-01-06 DIAGNOSIS — O34219 Maternal care for unspecified type scar from previous cesarean delivery: Secondary | ICD-10-CM

## 2013-01-06 DIAGNOSIS — O09299 Supervision of pregnancy with other poor reproductive or obstetric history, unspecified trimester: Secondary | ICD-10-CM

## 2013-01-06 DIAGNOSIS — Z1389 Encounter for screening for other disorder: Secondary | ICD-10-CM

## 2013-01-06 DIAGNOSIS — N859 Noninflammatory disorder of uterus, unspecified: Secondary | ICD-10-CM

## 2013-01-06 DIAGNOSIS — O99330 Smoking (tobacco) complicating pregnancy, unspecified trimester: Secondary | ICD-10-CM

## 2013-01-06 DIAGNOSIS — O479 False labor, unspecified: Secondary | ICD-10-CM

## 2013-01-06 LAB — POCT URINALYSIS DIPSTICK
Glucose, UA: NEGATIVE
Leukocytes, UA: NEGATIVE
Nitrite, UA: NEGATIVE

## 2013-01-06 NOTE — Progress Notes (Signed)
Pt states went to [facility] for regular contractions, pt states was told dilated to 3.

## 2013-01-06 NOTE — MAU Note (Signed)
Pt states she is having contractions Q 5 min apart

## 2013-01-06 NOTE — MAU Provider Note (Signed)
History     CSN: [id number]  Arrival date and time: 01/06/13 2226   First Provider Initiated Contact with Patient 01/06/13 2308      Chief Complaint  Patient presents with  . Contractions   HPI Autumn Herrera is a 27 y.o. [id number] at [redacted]w[redacted]d who presents with complaints of contractions.  Patient receives her [facility].  Patient was seen in the MAU yesterday for complaints of contractions.  Cervix was fingertip and closed at that time (she told Dr. Emelda Fear she was 3cms, but upon further discussion, pt was probably confused).  She presents this evening with complaints of regular contractions (5 mins apart).  No bleeding or loss of fluid.  No other complaints at this time.  *Of note, patient has a history of narcotic use and states that she has been weaned off.  However, she reported that she took 1 Hydrocodone yesterday and 1 tonight around 8 pm*   Past Medical History  Diagnosis Date  . Degenerative disc disease, lumbar   . Disc herniation   . Pregnant   . Asthma   . Depression   . DM (diabetes mellitus), gestational 2006    on insulin  . History of cesarean delivery, currently pregnant 11/08/2012  . Gestational diabetes   . UTI (lower urinary tract infection)     Past Surgical History  Procedure Laterality Date  . Knee surgery    . Cesarean section    . Dilation and curettage of uterus N/A 2010    Family History  Problem Relation Age of Onset  . Diabetes Other   . Hypertension Other   . Stroke Other   . Cancer Other     Leukemia and Breast   . CAD Other     History  Substance Use Topics  . Smoking status: Current Every Day Smoker -- 1.00 packs/day    Types: Cigarettes  . Smokeless tobacco: Not on file  . Alcohol Use: No    Allergies:  Allergies  Allergen Reactions  . Penicillins Anaphylaxis and Rash    Able to take cephalosporins   . Latex Swelling and Rash  . Pitocin (Oxytocin) Other (See Comments)    Passed out when given before.     Prescriptions prior to admission  Medication Sig Dispense Refill  . calcium carbonate (TUMS EX) 750 MG chewable tablet Chew 1 tablet by mouth 2 (two) times daily as needed for heartburn.       Marland Kitchen HYDROcodone-acetaminophen (NORCO/VICODIN) 5-325 MG per tablet Take 1 tablet by mouth every 6 (six) hours as needed for pain.  10 tablet  0  . Prenatal Vit-Fe Fumarate-FA (PRENATAL MULTIVITAMIN) TABS Take 1 tablet by mouth daily.         ROS Per HPI Physical Exam   Blood pressure 108/67, pulse 73, temperature 98.3 F (36.8 C), temperature source Oral, resp. rate 16, height 5\' 4"  (1.626 m), weight 58.514 kg (129 lb), last menstrual period 04/12/2012, SpO2 98.00%.  Physical Exam Gen: well appearing, NAD.  Ext: no appreciable lower extremity edema bilaterally Neuro: No focal deficits. GU: normal appearing external genitalia Cervical exam: Dilation: 1 Effacement (%): Thick Cervical Position: Posterior  FHR: baseline 140, mod variability, 15x15 accels, No decels Toco: Irritability noted. No regular contractions   MAU Course  Procedures  Assessment and Plan  Autumn Herrera is a 27 y.o. [id number] at [redacted]w[redacted]d who presents with reports of contractions. - Cervix 1/Thick/Posterior and irritability noted on Toco - Will discharge home with  close outpatient follow up as patient is not in labor. - C-Section scheduled for 6/10  Everlene Other 01/06/2013, 11:09 PM

## 2013-01-06 NOTE — Progress Notes (Signed)
Repeat c/s scheduled. Seen at [facility], was 3 cm , no change., sent home. Preop labs mmonday.[email or web address]

## 2013-01-07 ENCOUNTER — Encounter (HOSPITAL_COMMUNITY): Payer: Self-pay

## 2013-01-07 LAB — URINE CULTURE

## 2013-01-09 ENCOUNTER — Other Ambulatory Visit: Payer: Self-pay | Admitting: Obstetrics and Gynecology

## 2013-01-09 NOTE — H&P (Signed)
Autumn Herrera is a 27 y.o. female presenting for scheduled repeat cesarean section on the morning of 01/11/2013. She is scheduled after pregnancy course followed at [facility]. She is [redacted] weeks gestation. Prenatal course has been uneventful. [facility] with no evidence of cervical change or active contraction pattern that was considered labor Contraception plans to use Depo-Provera, she plans to breast-feed, she does not desire permanent sterilization at this time  History OB History   Grav Para Term Preterm Abortions TAB SAB Ect Mult Living   4 2 2  1  1   2     Past Medical History  Diagnosis Date  . Degenerative disc disease, lumbar   . Disc herniation   . Pregnant   . Asthma   . Depression   . DM (diabetes mellitus), gestational 2006    on insulin  . History of cesarean delivery, currently pregnant 11/08/2012  . Gestational diabetes   . UTI (lower urinary tract infection)    Past Surgical History  Procedure Laterality Date  . Knee surgery    . Cesarean section    . Dilation and curettage of uterus N/A 2010   Family History: family history includes CAD in her other; Cancer in her other; Diabetes in her other; Hypertension in her other; and Stroke in her other. Social History:  reports that she has been smoking Cigarettes.  She has been smoking about 1.00 pack per day. She does not have any smokeless tobacco history on file. She reports that she does not drink alcohol or use illicit drugs.   Prenatal Transfer Tool  Maternal Diabetes: No Genetic Screening: Normal Maternal Ultrasounds/Referrals: Normal Fetal Ultrasounds or other Referrals:  None Maternal Substance Abuse:  No Significant Maternal Medications:  None Significant Maternal Lab Results:  Lab values include: Group B Strep negative Other Comments:  None  ROS  patient notes  mild contractions each evening, but [facility] shown no  cervical change. She was reported at 3 cm dilated at last evaluation last Thursday.    Last menstrual period 04/12/2012. Exam Physical Exam Physical Examination: General appearance - alert, well appearing, and in no distress, oriented to person, place, and time and normal appearing weight Mental status - alert, oriented to person, place, and time, normal mood, behavior, speech, dress, motor activity, and thought processes Neck - supple, no significant adenopathy Chest - clear to auscultation, no wheezes, rales or rhonchi, symmetric air entry Heart - normal rate and regular rhythm Abdomen - soft, nontender, nondistended, no masses or organomegaly Pelvic - cervical exam Thursday 3 cm dilated Extremities - peripheral pulses normal, no pedal edema, no clubbing or cyanosis, Homan's sign negative bilaterally Prenatal labs: ABO, Rh: --/--/A POS (05/16 1509) Antibody: NEG (05/16 1509) Rubella: Immune (10/21 0000) RPR: NON REAC (03/28 0943)  HBsAg: Negative (10/21 0000)  HIV: NON REACTIVE (03/28 0943)  GBS:     Assessment/Plan:  1. Pregnancy [redacted] weeks gestation, prior cesarean section x2 desiring repeat cesarean section 2. GBS negative 3. Depo-Provera for future contraception Patient questions addressed regarding future surgery and answered to patient's satisfaction     Man Bonneau V 01/09/2013, 8:34 PM     

## 2013-01-10 ENCOUNTER — Encounter (HOSPITAL_COMMUNITY): Payer: Self-pay

## 2013-01-10 ENCOUNTER — Encounter (HOSPITAL_COMMUNITY)
Admission: RE | Admit: 2013-01-10 | Discharge: 2013-01-10 | Disposition: A | Discharge status: Home / Self Care | Payer: Medicaid Other | Source: Ambulatory Visit | Attending: Obstetrics and Gynecology | Admitting: Obstetrics and Gynecology

## 2013-01-10 VITALS — BP 115/85 | HR 75 | Temp 97.2°F | Resp 18 | Ht 64.0 in | Wt 127.0 lb

## 2013-01-10 DIAGNOSIS — O2303 Infections of kidney in pregnancy, third trimester: Secondary | ICD-10-CM

## 2013-01-10 DIAGNOSIS — O34219 Maternal care for unspecified type scar from previous cesarean delivery: Secondary | ICD-10-CM

## 2013-01-10 DIAGNOSIS — Z3483 Encounter for supervision of other normal pregnancy, third trimester: Secondary | ICD-10-CM

## 2013-01-10 LAB — CBC
Hemoglobin: 10.8 g/dL — ABNORMAL LOW (ref 12.0–15.0)
MCH: 28.1 pg (ref 26.0–34.0)
Platelets: 265 10*3/uL (ref 150–400)
RBC: 3.84 MIL/uL — ABNORMAL LOW (ref 3.87–5.11)
WBC: 12.4 10*3/uL — ABNORMAL HIGH (ref 4.0–10.5)

## 2013-01-10 LAB — TYPE AND SCREEN
ABO/RH(D): A POS
Antibody Screen: NEGATIVE

## 2013-01-10 LAB — SYPHILIS: RPR W/REFLEX TO RPR TITER AND TREPONEMAL ANTIBODIES, TRADITIONAL SCREENING AND DIAGNOSIS ALGORITHM: RPR Ser Ql: NONREACTIVE

## 2013-01-10 NOTE — Patient Instructions (Signed)
   Your procedure is scheduled on:june 10 at 0730  Enter through the Main Entrance of [facility] at:0600 Pick up the phone at the desk and dial [phone] and inform us of your arrival.  Please call this number if you have any problems the morning of surgery: [phone]  Remember: Do not eat food after midnight: Do not drink clear liquids after: Take these medicines the morning of surgery with a SIP OF WATER:  Do not wear jewelry, make-up, or FINGER nail polish No metal in your hair or on your body. Do not wear lotions, powders, perfumes. You may wear deodorant.  Please use your CHG wash as directed prior to surgery.  Do not shave anywhere for at least 12 hours prior to first CHG shower.  Do not bring valuables to the hospital. Contacts, dentures or bridgework may not be worn into surgery.  Leave suitcase in the car. After Surgery it may be brought to your room. For patients being admitted to the hospital, checkout time is 11:00am the day of discharge.  Patients discharged on the day of surgery will not be allowed to drive home.

## 2013-01-11 ENCOUNTER — Encounter (HOSPITAL_COMMUNITY): Payer: Self-pay | Admitting: *Deleted

## 2013-01-11 ENCOUNTER — Inpatient Hospital Stay (HOSPITAL_COMMUNITY): Payer: Medicaid Other | Admitting: Anesthesiology

## 2013-01-11 ENCOUNTER — Encounter (HOSPITAL_COMMUNITY)
Admission: RE | Disposition: A | Discharge status: Home / Self Care | Payer: Self-pay | Source: Ambulatory Visit | Attending: Obstetrics and Gynecology

## 2013-01-11 ENCOUNTER — Inpatient Hospital Stay (HOSPITAL_COMMUNITY)
Admission: RE | Admit: 2013-01-11 | Discharge: 2013-01-13 | DRG: 766 | Disposition: A | Discharge status: Home / Self Care | Payer: Medicaid Other | Source: Ambulatory Visit | Attending: Obstetrics and Gynecology | Admitting: Obstetrics and Gynecology

## 2013-01-11 ENCOUNTER — Encounter (HOSPITAL_COMMUNITY): Payer: Self-pay | Admitting: Anesthesiology

## 2013-01-11 DIAGNOSIS — Z3483 Encounter for supervision of other normal pregnancy, third trimester: Secondary | ICD-10-CM

## 2013-01-11 DIAGNOSIS — O34219 Maternal care for unspecified type scar from previous cesarean delivery: Principal | ICD-10-CM | POA: Diagnosis present

## 2013-01-11 DIAGNOSIS — O99334 Smoking (tobacco) complicating childbirth: Secondary | ICD-10-CM

## 2013-01-11 DIAGNOSIS — O2303 Infections of kidney in pregnancy, third trimester: Secondary | ICD-10-CM

## 2013-01-11 SURGERY — Surgical Case
Anesthesia: Spinal | Site: Abdomen | Wound class: Clean Contaminated

## 2013-01-11 MED ORDER — FENTANYL CITRATE 0.05 MG/ML IJ SOLN: 25.0000 ug | INTRAMUSCULAR | Status: DC | PRN

## 2013-01-11 MED ORDER — ZOLPIDEM TARTRATE 5 MG PO TABS: 5.0000 mg | ORAL_TABLET | Freq: Every evening | ORAL | Status: DC | PRN

## 2013-01-11 MED ORDER — KETOROLAC TROMETHAMINE 60 MG/2ML IM SOLN
INTRAMUSCULAR | Status: AC
  Administered 2013-01-11: 60 mg via INTRAMUSCULAR
  Filled 2013-01-11: qty 2

## 2013-01-11 MED ORDER — LACTATED RINGERS IV SOLN
INTRAVENOUS | Status: DC
  Administered 2013-01-11 (×3): via INTRAVENOUS

## 2013-01-11 MED ORDER — SIMETHICONE 80 MG PO CHEW
80.0000 mg | CHEWABLE_TABLET | Freq: Three times a day (TID) | ORAL | Status: DC
  Administered 2013-01-11 – 2013-01-13 (×5): 80 mg via ORAL

## 2013-01-11 MED ORDER — DIPHENHYDRAMINE HCL 50 MG/ML IJ SOLN: 25.0000 mg | INTRAMUSCULAR | Status: DC | PRN

## 2013-01-11 MED ORDER — ONDANSETRON HCL 4 MG/2ML IJ SOLN
INTRAMUSCULAR | Status: DC | PRN
  Administered 2013-01-11: 4 mg via INTRAVENOUS

## 2013-01-11 MED ORDER — MORPHINE SULFATE (PF) 0.5 MG/ML IJ SOLN
INTRAMUSCULAR | Status: DC | PRN
  Administered 2013-01-11: .2 mg via INTRATHECAL

## 2013-01-11 MED ORDER — SODIUM CHLORIDE 0.9 % IJ SOLN: 3.0000 mL | INTRAMUSCULAR | Status: DC | PRN

## 2013-01-11 MED ORDER — KETOROLAC TROMETHAMINE 30 MG/ML IJ SOLN: 30.0000 mg | Freq: Four times a day (QID) | INTRAMUSCULAR | Status: AC | PRN

## 2013-01-11 MED ORDER — PHENYLEPHRINE HCL 10 MG/ML IJ SOLN
INTRAMUSCULAR | Status: DC | PRN
  Administered 2013-01-11 (×2): 40 ug via INTRAVENOUS

## 2013-01-11 MED ORDER — ONDANSETRON HCL 4 MG/2ML IJ SOLN: 4.0000 mg | Freq: Once | INTRAMUSCULAR | Status: DC | PRN

## 2013-01-11 MED ORDER — TETANUS-DIPHTH-ACELL PERTUSSIS 5-2.5-18.5 LF-MCG/0.5 IM SUSP
0.5000 mL | Freq: Once | INTRAMUSCULAR | Status: AC
  Administered 2013-01-12: 0.5 mL via INTRAMUSCULAR
  Filled 2013-01-11: qty 0.5

## 2013-01-11 MED ORDER — DIPHENHYDRAMINE HCL 25 MG PO CAPS
25.0000 mg | ORAL_CAPSULE | ORAL | Status: DC | PRN
  Administered 2013-01-11: 25 mg via ORAL
  Filled 2013-01-11 (×2): qty 1

## 2013-01-11 MED ORDER — NALBUPHINE SYRINGE 5 MG/0.5 ML
5.0000 mg | INJECTION | INTRAMUSCULAR | Status: DC | PRN
  Administered 2013-01-11: 5 mg via INTRAVENOUS
  Filled 2013-01-11 (×3): qty 1

## 2013-01-11 MED ORDER — KETOROLAC TROMETHAMINE 60 MG/2ML IM SOLN: 60.0000 mg | Freq: Once | INTRAMUSCULAR | Status: AC | PRN

## 2013-01-11 MED ORDER — CEFAZOLIN SODIUM-DEXTROSE 2-3 GM-% IV SOLR
2.0000 g | Freq: Once | INTRAVENOUS | Status: DC
  Filled 2013-01-11: qty 50

## 2013-01-11 MED ORDER — LANOLIN HYDROUS EX OINT: 1.0000 "application " | TOPICAL_OINTMENT | CUTANEOUS | Status: DC | PRN

## 2013-01-11 MED ORDER — DIBUCAINE 1 % RE OINT: 1.0000 "application " | TOPICAL_OINTMENT | RECTAL | Status: DC | PRN

## 2013-01-11 MED ORDER — LACTATED RINGERS IV SOLN: INTRAVENOUS | Status: DC

## 2013-01-11 MED ORDER — SCOPOLAMINE 1 MG/3DAYS TD PT72
MEDICATED_PATCH | TRANSDERMAL | Status: AC
  Administered 2013-01-11: 1.5 mg via TRANSDERMAL
  Filled 2013-01-11: qty 1

## 2013-01-11 MED ORDER — SCOPOLAMINE 1 MG/3DAYS TD PT72: 1.0000 | MEDICATED_PATCH | Freq: Once | TRANSDERMAL | Status: DC

## 2013-01-11 MED ORDER — OXYCODONE-ACETAMINOPHEN 5-325 MG PO TABS
1.0000 | ORAL_TABLET | ORAL | Status: DC | PRN
  Administered 2013-01-11 – 2013-01-13 (×10): 2 via ORAL
  Filled 2013-01-11 (×10): qty 2

## 2013-01-11 MED ORDER — MORPHINE SULFATE 0.5 MG/ML IJ SOLN
INTRAMUSCULAR | Status: AC
  Filled 2013-01-11: qty 20

## 2013-01-11 MED ORDER — WITCH HAZEL-GLYCERIN EX PADS: 1.0000 "application " | MEDICATED_PAD | CUTANEOUS | Status: DC | PRN

## 2013-01-11 MED ORDER — OXYTOCIN 10 UNIT/ML IJ SOLN
40.0000 [IU] | INTRAVENOUS | Status: DC | PRN
  Administered 2013-01-11: 40 [IU] via INTRAVENOUS

## 2013-01-11 MED ORDER — ONDANSETRON HCL 4 MG/2ML IJ SOLN
INTRAMUSCULAR | Status: AC
  Filled 2013-01-11: qty 2

## 2013-01-11 MED ORDER — FENTANYL CITRATE 0.05 MG/ML IJ SOLN
INTRAMUSCULAR | Status: AC
  Filled 2013-01-11: qty 4

## 2013-01-11 MED ORDER — SENNOSIDES-DOCUSATE SODIUM 8.6-50 MG PO TABS
2.0000 | ORAL_TABLET | Freq: Every day | ORAL | Status: DC
  Administered 2013-01-11 – 2013-01-13 (×2): 2 via ORAL

## 2013-01-11 MED ORDER — MENTHOL 3 MG MT LOZG: 1.0000 | LOZENGE | OROMUCOSAL | Status: DC | PRN

## 2013-01-11 MED ORDER — KETOROLAC TROMETHAMINE 30 MG/ML IJ SOLN
30.0000 mg | Freq: Four times a day (QID) | INTRAMUSCULAR | Status: AC | PRN
  Administered 2013-01-11: 30 mg via INTRAVENOUS
  Filled 2013-01-11: qty 1

## 2013-01-11 MED ORDER — ONDANSETRON HCL 4 MG PO TABS: 4.0000 mg | ORAL_TABLET | ORAL | Status: DC | PRN

## 2013-01-11 MED ORDER — PHENYLEPHRINE 40 MCG/ML (10ML) SYRINGE FOR IV PUSH (FOR BLOOD PRESSURE SUPPORT)
PREFILLED_SYRINGE | INTRAVENOUS | Status: AC
  Filled 2013-01-11: qty 5

## 2013-01-11 MED ORDER — ONDANSETRON HCL 4 MG/2ML IJ SOLN: 4.0000 mg | INTRAMUSCULAR | Status: DC | PRN

## 2013-01-11 MED ORDER — DIPHENHYDRAMINE HCL 25 MG PO CAPS: 25.0000 mg | ORAL_CAPSULE | Freq: Four times a day (QID) | ORAL | Status: DC | PRN

## 2013-01-11 MED ORDER — MEPERIDINE HCL 25 MG/ML IJ SOLN: 6.2500 mg | INTRAMUSCULAR | Status: DC | PRN

## 2013-01-11 MED ORDER — OXYTOCIN 40 UNITS IN LACTATED RINGERS INFUSION - SIMPLE MED: 62.5000 mL/h | INTRAVENOUS | Status: AC

## 2013-01-11 MED ORDER — IBUPROFEN 600 MG PO TABS
600.0000 mg | ORAL_TABLET | Freq: Four times a day (QID) | ORAL | Status: DC
  Administered 2013-01-12 – 2013-01-13 (×7): 600 mg via ORAL
  Filled 2013-01-11 (×7): qty 1

## 2013-01-11 MED ORDER — KETOROLAC TROMETHAMINE 30 MG/ML IJ SOLN: 15.0000 mg | Freq: Once | INTRAMUSCULAR | Status: DC | PRN

## 2013-01-11 MED ORDER — NALOXONE HCL 0.4 MG/ML IJ SOLN: 0.4000 mg | INTRAMUSCULAR | Status: DC | PRN

## 2013-01-11 MED ORDER — PRENATAL MULTIVITAMIN CH
1.0000 | ORAL_TABLET | Freq: Every day | ORAL | Status: DC
  Administered 2013-01-11 – 2013-01-12 (×2): 1 via ORAL
  Filled 2013-01-11 (×2): qty 1

## 2013-01-11 MED ORDER — SIMETHICONE 80 MG PO CHEW: 80.0000 mg | CHEWABLE_TABLET | ORAL | Status: DC | PRN

## 2013-01-11 MED ORDER — OXYTOCIN 10 UNIT/ML IJ SOLN
INTRAMUSCULAR | Status: AC
  Filled 2013-01-11: qty 4

## 2013-01-11 MED ORDER — NALBUPHINE SYRINGE 5 MG/0.5 ML
5.0000 mg | INJECTION | INTRAMUSCULAR | Status: DC | PRN
  Filled 2013-01-11: qty 1

## 2013-01-11 MED ORDER — DIPHENHYDRAMINE HCL 50 MG/ML IJ SOLN: 12.5000 mg | INTRAMUSCULAR | Status: DC | PRN

## 2013-01-11 MED ORDER — ONDANSETRON HCL 4 MG/2ML IJ SOLN: 4.0000 mg | Freq: Three times a day (TID) | INTRAMUSCULAR | Status: DC | PRN

## 2013-01-11 MED ORDER — METOCLOPRAMIDE HCL 5 MG/ML IJ SOLN: 10.0000 mg | Freq: Three times a day (TID) | INTRAMUSCULAR | Status: DC | PRN

## 2013-01-11 MED ORDER — FENTANYL CITRATE 0.05 MG/ML IJ SOLN
INTRAMUSCULAR | Status: DC | PRN
  Administered 2013-01-11: 12.5 ug via INTRATHECAL

## 2013-01-11 MED ORDER — CEFAZOLIN SODIUM-DEXTROSE 2-3 GM-% IV SOLR
INTRAVENOUS | Status: AC
  Administered 2013-01-11: 2 g via INTRAVENOUS
  Filled 2013-01-11: qty 50

## 2013-01-11 MED ORDER — CEFAZOLIN SODIUM 1-5 GM-% IV SOLN: 1.0000 g | Freq: Four times a day (QID) | INTRAVENOUS | Status: DC

## 2013-01-11 MED ORDER — DEXTROSE 5 % IV SOLN
1.0000 ug/kg/h | INTRAVENOUS | Status: DC | PRN
  Filled 2013-01-11: qty 2

## 2013-01-11 MED ORDER — BUPIVACAINE IN DEXTROSE 0.75-8.25 % IT SOLN
INTRATHECAL | Status: DC | PRN
  Administered 2013-01-11: 1.4 mL via INTRATHECAL

## 2013-01-11 SURGICAL SUPPLY — 37 items
BENZOIN TINCTURE PRP APPL 2/3 (GAUZE/BANDAGES/DRESSINGS) ×2 IMPLANT
CLAMP CORD UMBIL (MISCELLANEOUS) IMPLANT
CLOTH BEACON ORANGE TIMEOUT ST (SAFETY) ×2 IMPLANT
DRAPE LG THREE QUARTER DISP (DRAPES) ×2 IMPLANT
DRSG OPSITE POSTOP 4X10 (GAUZE/BANDAGES/DRESSINGS) ×2 IMPLANT
DURAPREP 26ML APPLICATOR (WOUND CARE) ×2 IMPLANT
ELECT REM PT RETURN 9FT ADLT (ELECTROSURGICAL) ×4
ELECTRODE REM PT RTRN 9FT ADLT (ELECTROSURGICAL) ×2 IMPLANT
EXTRACTOR VACUUM KIWI (MISCELLANEOUS) IMPLANT
GAUZE SPONGE 4X4 12PLY STRL LF (GAUZE/BANDAGES/DRESSINGS) ×2 IMPLANT
GLOVE BIO SURGEON ST LM GN SZ9 (GLOVE) ×2 IMPLANT
GLOVE BIOGEL PI IND STRL 9 (GLOVE) ×1 IMPLANT
GLOVE BIOGEL PI INDICATOR 9 (GLOVE) ×1
GOWN PREVENTION PLUS XLARGE (GOWN DISPOSABLE) ×2 IMPLANT
GOWN STRL REIN 3XL LVL4 (GOWN DISPOSABLE) ×2 IMPLANT
GOWN STRL REIN XL XLG (GOWN DISPOSABLE) ×4 IMPLANT
NEEDLE HYPO 25X5/8 SAFETYGLIDE (NEEDLE) IMPLANT
NS IRRIG 1000ML POUR BTL (IV SOLUTION) ×2 IMPLANT
PACK C SECTION WH (CUSTOM PROCEDURE TRAY) ×2 IMPLANT
PAD ABD 7.5X8 STRL (GAUZE/BANDAGES/DRESSINGS) ×2 IMPLANT
PAD OB MATERNITY 4.3X12.25 (PERSONAL CARE ITEMS) ×2 IMPLANT
RETRACTOR WND ALEXIS 25 LRG (MISCELLANEOUS) IMPLANT
RTRCTR C-SECT PINK 25CM LRG (MISCELLANEOUS) IMPLANT
RTRCTR WOUND ALEXIS 25CM LRG (MISCELLANEOUS)
SPONGE GAUZE 4X4 12PLY (GAUZE/BANDAGES/DRESSINGS) ×2 IMPLANT
STRIP CLOSURE SKIN 1/2X4 (GAUZE/BANDAGES/DRESSINGS) ×2 IMPLANT
SUT CHROMIC 0 CTX 36 (SUTURE) ×4 IMPLANT
SUT VIC AB 0 CT1 27 (SUTURE) ×2
SUT VIC AB 0 CT1 27XBRD ANBCTR (SUTURE) ×2 IMPLANT
SUT VIC AB 2-0 CT1 27 (SUTURE) ×3
SUT VIC AB 2-0 CT1 TAPERPNT 27 (SUTURE) ×3 IMPLANT
SUT VIC AB 4-0 KS 27 (SUTURE) ×2 IMPLANT
SYR BULB IRRIGATION 50ML (SYRINGE) IMPLANT
TOWEL OR 17X24 6PK STRL BLUE (TOWEL DISPOSABLE) ×2 IMPLANT
TRAY FOLEY BAG SILVER LF 14FR (CATHETERS) ×2 IMPLANT
TRAY FOLEY CATH 14FR (SET/KITS/TRAYS/PACK) IMPLANT
WATER STERILE IRR 1000ML POUR (IV SOLUTION) ×2 IMPLANT

## 2013-01-11 NOTE — H&P (View-Only) (Signed)
Autumn Herrera is a 27 y.o. female presenting for scheduled repeat cesarean section on the morning of 01/11/2013. She is scheduled after pregnancy course followed at [facility]. She is [redacted] weeks gestation. Prenatal course has been uneventful. [facility] with no evidence of cervical change or active contraction pattern that was considered labor Contraception plans to use Depo-Provera, she plans to breast-feed, she does not desire permanent sterilization at this time  History OB History   Grav Para Term Preterm Abortions TAB SAB Ect Mult Living   4 2 2  1  1   2      Past Medical History  Diagnosis Date  . Degenerative disc disease, lumbar   . Disc herniation   . Pregnant   . Asthma   . Depression   . DM (diabetes mellitus), gestational 2006    on insulin  . History of cesarean delivery, currently pregnant 11/08/2012  . Gestational diabetes   . UTI (lower urinary tract infection)    Past Surgical History  Procedure Laterality Date  . Knee surgery    . Cesarean section    . Dilation and curettage of uterus N/A 2010   Family History: family history includes CAD in her other; Cancer in her other; Diabetes in her other; Hypertension in her other; and Stroke in her other. Social History:  reports that she has been smoking Cigarettes.  She has been smoking about 1.00 pack per day. She does not have any smokeless tobacco history on file. She reports that she does not drink alcohol or use illicit drugs.   Prenatal Transfer Tool  Maternal Diabetes: No Genetic Screening: Normal Maternal Ultrasounds/Referrals: Normal Fetal Ultrasounds or other Referrals:  None Maternal Substance Abuse:  No Significant Maternal Medications:  None Significant Maternal Lab Results:  Lab values include: Group B Strep negative Other Comments:  None  ROS  patient notes  mild contractions each evening, but 2 trips to [facility] hospital have shown no  cervical change. She was reported at 3 cm dilated at last evaluation last Thursday.    Last menstrual period 04/12/2012. Exam Physical Exam Physical Examination: General appearance - alert, well appearing, and in no distress, oriented to person, place, and time and normal appearing weight Mental status - alert, oriented to person, place, and time, normal mood, behavior, speech, dress, motor activity, and thought processes Neck - supple, no significant adenopathy Chest - clear to auscultation, no wheezes, rales or rhonchi, symmetric air entry Heart - normal rate and regular rhythm Abdomen - soft, nontender, nondistended, no masses or organomegaly Pelvic - cervical exam Thursday 3 cm dilated Extremities - peripheral pulses normal, no pedal edema, no clubbing or cyanosis, Homan's sign negative bilaterally Prenatal labs: ABO, Rh: --/--/A POS (05/16 1509) Antibody: NEG (05/16 1509) Rubella: Immune (10/21 0000) RPR: NON REAC (03/28 0943)  HBsAg: Negative (10/21 0000)  HIV: NON REACTIVE (03/28 0943)  GBS:     Assessment/Plan:  1. Pregnancy [redacted] weeks gestation, prior cesarean section x2 desiring repeat cesarean section 2. GBS negative 3. Depo-Provera for future contraception Patient questions addressed regarding future surgery and answered to patient's satisfaction     Tilda Burrow 01/09/2013, 8:34 PM

## 2013-01-11 NOTE — Anesthesia Postprocedure Evaluation (Signed)
Anesthesia Post Note  Patient: Autumn Herrera  Procedure(s) Performed: Procedure(s) (LRB): REPEAT CESAREAN SECTION (N/A)  Anesthesia type: Spinal  Patient location: PACU  Post pain: Pain level controlled  Post assessment: Post-op Vital signs reviewed  Last Vitals:  Filed Vitals:   01/11/13 0855  BP: 108/70  Pulse: 57  Temp: 36.3 C  Resp: 16    Post vital signs: Reviewed  Level of consciousness: awake  Complications: No apparent anesthesia complications

## 2013-01-11 NOTE — Brief Op Note (Signed)
01/11/2013  8:56 AM  PATIENT:  Autumn Herrera  27 y.o. female  PRE-OPERATIVE DIAGNOSIS:  Previous Cesarean Section pregnancy [redacted] weeks gestation  POST-OPERATIVE DIAGNOSIS:  Previous Cesarean Section, pregnancy [redacted] weeks gestation  PROCEDURE:  Procedure(s): REPEAT CESAREAN SECTION (N/A), low transverse uterine incision  SURGEON:  Surgeon(s) and Role:    * Tilda Burrow, MD - Primary  PHYSICIAN ASSISTANT: None  ASSISTANTS: none   ANESTHESIA:   spinal  EBL:  Total I/O In: 2400 [I.V.:2400] Out: 700 [Urine:100; Blood:600]  BLOOD ADMINISTERED:none  DRAINS: Urinary Catheter (Foley)   LOCAL MEDICATIONS USED:  NONE  SPECIMEN:  Source of Specimen:  Placenta to our refrigerator  DISPOSITION OF SPECIMEN:  N/A  COUNTS:  YES  TOURNIQUET:  * No tourniquets in log *  DICTATION: .Dragon Dictation  PLAN OF CARE: Admit to inpatient   PATIENT DISPOSITION:  PACU - hemodynamically stable.   Delay start of Pharmacological VTE agent (>24hrs) due to surgical blood loss or risk of bleeding: not applicable

## 2013-01-11 NOTE — Preoperative (Signed)
Beta Blockers   Reason not to administer Beta Blockers:Not Applicable 

## 2013-01-11 NOTE — Op Note (Signed)
Preop: Pregnancy [redacted] weeks gestation prior cesarean section x2, not desiring trial of labor, repeat cesarean section scheduled Postop: Same, delivered Procedure: Repeat low transverse cesarean section Surgeon: Emelda Fear Details of procedure: Patient was taken operating room prepped and draped, after spinal anesthesia introduced timeout conducted with Foley catheter inserted and Ancef 2 g administered intravenously. Transverse lower abdominal incision was repeated with excision of the old cicatrix, and easy into the abdominal cavity encountered. There were minimal adhesions to the intra-abdominal wall, a small omental adhesion on the right side of the midline was the only effusion encountered. Was quite high on the uterus in transverse incision was made in the uterus after bladder flap development. Uterine incision was very thin area, the fetal vertex was rotated into the incision, and a healthy female infant delivered without difficulty and passed to the pediatricians. See their notes for further details. Placenta delivered easily, intact Schultz presentation, and membrane remnants were extracted. The uterus was confirmed visually is being empty. Irrigation was performed. Single-layer running locking closure with 0 chromic was performed, with good hemostasis and bladder flap loosely reapproximated with interrupted 2-0 Vicryl. Abdomen was irrigated, anterior peritoneum closed with running 2-0 Vicryl, the fascia end of some fibrous tissue and then reapproximated with running 0 Vicryl, subcuticular closure performed after 3 interrupted 2-0 Vicryl closures of the potential space in the subcutaneous fat sponge and needle counts were correct EBL 500 cc.

## 2013-01-11 NOTE — Anesthesia Preprocedure Evaluation (Signed)
Anesthesia Evaluation  Patient identified by MRN, date of birth, ID band Patient awake    Reviewed: Allergy & Precautions, H&P , NPO status , Patient's Chart, lab work & pertinent test results  Airway Mallampati: II TM Distance: >3 FB Neck ROM: full    Dental no notable dental hx.    Pulmonary neg pulmonary ROS,  breath sounds clear to auscultation  Pulmonary exam normal       Cardiovascular negative cardio ROS  Rhythm:regular Rate:Normal     Neuro/Psych negative neurological ROS     GI/Hepatic negative GI ROS, Neg liver ROS,   Endo/Other    Renal/GU   negative genitourinary   Musculoskeletal negative musculoskeletal ROS (+)   Abdominal   Peds negative pediatric ROS (+)  Hematology negative hematology ROS (+)   Anesthesia Other Findings   Reproductive/Obstetrics (+) Pregnancy                           Anesthesia Physical Anesthesia Plan  ASA: II  Anesthesia Plan: Spinal   Post-op Pain Management:    Induction:   Airway Management Planned:   Additional Equipment:   Intra-op Plan:   Post-operative Plan:   Informed Consent: I have reviewed the patients History and Physical, chart, labs and discussed the procedure including the risks, benefits and alternatives for the proposed anesthesia with the patient or authorized representative who has indicated his/her understanding and acceptance.     Plan Discussed with: CRNA and Surgeon  Anesthesia Plan Comments:         Anesthesia Quick Evaluation

## 2013-01-11 NOTE — Transfer of Care (Signed)
Immediate Anesthesia Transfer of Care Note  Patient: Autumn Herrera  Procedure(s) Performed: Procedure(s): REPEAT CESAREAN SECTION (N/A)  Patient Location: PACU  Anesthesia Type:Spinal  Level of Consciousness: awake, alert  and oriented  Airway & Oxygen Therapy: Patient Spontanous Breathing  Post-op Assessment: Report given to PACU RN and Post -op Vital signs reviewed and stable  Post vital signs: Reviewed and stable  Complications: No apparent anesthesia complications

## 2013-01-11 NOTE — Progress Notes (Signed)
UR completed 

## 2013-01-11 NOTE — Interval H&P Note (Signed)
History and Physical Interval Note:  01/11/2013 7:23 AM  Autumn Herrera  has presented today for surgery, with the diagnosis of Previous Cesarean Section  The various methods of treatment have been discussed with the patient and family. After consideration of risks, benefits and other options for treatment, the patient has consented to  Procedure(s): REPEAT CESAREAN SECTION (N/A) as a surgical intervention .  The patient's history has been reviewed, patient examined, no change in status, stable for surgery.  I have reviewed the patient's chart and labs.  Questions were answered to the patient's satisfaction.     Tilda Burrow

## 2013-01-11 NOTE — Anesthesia Procedure Notes (Signed)
Spinal  Patient location during procedure: OR Start time: 01/11/2013 7:26 AM End time: 01/11/2013 7:29 AM Staffing Anesthesiologist: Sandrea Hughs Performed by: anesthesiologist  Preanesthetic Checklist Completed: patient identified, surgical consent, pre-op evaluation, timeout performed, IV checked, risks and benefits discussed and monitors and equipment checked Spinal Block Patient position: sitting Prep: DuraPrep Patient monitoring: heart rate, cardiac monitor, continuous pulse ox and blood pressure Approach: midline Location: L3-4 Injection technique: single-shot Needle Needle type: Sprotte  Needle gauge: 24 G Needle length: 9 cm Needle insertion depth: 4 cm Assessment Sensory level: T4

## 2013-01-12 ENCOUNTER — Encounter (HOSPITAL_COMMUNITY): Payer: Self-pay | Admitting: Obstetrics and Gynecology

## 2013-01-12 LAB — CBC
HCT: 29.1 % — ABNORMAL LOW (ref 36.0–46.0)
Hemoglobin: 9.7 g/dL — ABNORMAL LOW (ref 12.0–15.0)
MCH: 28.6 pg (ref 26.0–34.0)
MCV: 85.8 fL (ref 78.0–100.0)
RBC: 3.39 MIL/uL — ABNORMAL LOW (ref 3.87–5.11)
RDW: 14.1 % (ref 11.5–15.5)

## 2013-01-12 LAB — BIRTH TISSUE RECOVERY COLLECTION (PLACENTA DONATION)

## 2013-01-12 MED ORDER — PNEUMOCOCCAL VAC POLYVALENT 25 MCG/0.5ML IJ INJ
0.5000 mL | INJECTION | INTRAMUSCULAR | Status: AC
  Administered 2013-01-12: 0.5 mL via INTRAMUSCULAR
  Filled 2013-01-12 (×2): qty 0.5

## 2013-01-12 NOTE — Progress Notes (Signed)
Subjective: Postpartum Day 1: Elective Repeat Cesarean Delivery Patient reports tolerating PO, + flatus and no problems voiding.  No BM yet. Pt reports that she has been up and walking around. Minimal abdominal/incision pain, well-controlled by percocet.  Reports minimal vaginal bleeding.  Planned to breastfeed, but reports difficulty with milk production similar to previous pregnancies. Received lactation consult yesterday and decided to bottle feed exclusively.  Plans for Depo shot for [facility].  Objective: Vital signs in last 24 hours: Temp:  [97.4 F (36.3 C)-98.7 F (37.1 C)] 98.3 F (36.8 C) (06/11 0400) Pulse Rate:  [50-100] 65 (06/11 0400) Resp:  [16-22] 18 (06/11 0400) BP: (94-116)/(52-73) 116/54 mmHg (06/11 0400) SpO2:  [96 %-100 %] 98 % (06/11 0400) Weight:  [57.607 kg (127 lb)] 57.607 kg (127 lb) (06/10 0944)  Physical Exam:  General: alert, cooperative and no distress Lochia: appropriate Uterine Fundus: firm Incision: healing well, no significant drainage, no dehiscence, no significant erythema. Tender to palpation. DVT Evaluation: No evidence of DVT seen on physical exam. Negative Homan's sign. No cords or calf tenderness. No significant calf/ankle edema.   Recent Labs  01/10/13 0900 01/12/13 0640  HGB 10.8* 9.7*  HCT 33.1* 29.1*    Assessment/Plan: Status post Cesarean section. Doing well postoperatively.  Continue current care. Plan for discharge tomorrow. Will follow-up with Dr. Emelda Fear in [place].  Ardis Hughs 01/12/2013, 7:16 AM  I have seen and examined this patient and I agree with the above. Cam Hai 7:47 AM 01/12/2013

## 2013-01-12 NOTE — Progress Notes (Signed)
Patient was referred for history of depression/anxiety.  * Referral screened out by Clinical Social Worker because none of the following criteria appear to apply:  ~ History of anxiety/depression during this pregnancy, or of post-partum depression.  ~ Diagnosis of anxiety and/or depression within last 3 years, as per pt. Issue in high school.  ~ History of depression due to pregnancy loss/loss of child  OR  * Patient's symptoms currently being treated with medication and/or therapy.  Please contact the Clinical Social Worker if needs arise, or by the patient's request.       

## 2013-01-13 MED ORDER — OXYCODONE-ACETAMINOPHEN 5-325 MG PO TABS: 1.0000 | ORAL_TABLET | ORAL | Status: DC | PRN

## 2013-01-13 MED ORDER — IBUPROFEN 600 MG PO TABS: 600.0000 mg | ORAL_TABLET | Freq: Four times a day (QID) | ORAL | Status: DC

## 2013-01-13 NOTE — Discharge Summary (Signed)
Obstetric Discharge Summary Reason for Admission: onset of labor and cesarean section Prenatal Procedures: none Intrapartum Procedures: cesarean: low cervical, transverse Postpartum Procedures: none Complications-Operative and Postpartum: none Hemoglobin  Date Value Range Status  01/12/2013 9.7* 12.0 - 15.0 g/dL Final     HCT  Date Value Range Status  01/12/2013 29.1* 36.0 - 46.0 % Final    Physical Exam:  Filed Vitals:   01/13/13 0600  BP: 117/78  Pulse: 60  Temp: 97.3 F (36.3 C)  Resp: 18    General: alert, cooperative and no distress Lochia: appropriate Uterine Fundus: firm Incision: healing well, no significant drainage, no dehiscence, no significant erythema DVT Evaluation: No evidence of DVT seen on physical exam. Negative Homan's sign. No cords or calf tenderness. No significant calf/ankle edema.  Discharge Diagnoses: Term Pregnancy-delivered  Discharge Information: Date: 01/13/2013 Activity: unrestricted Diet: routine Medications: PNV, Ibuprofen and Percocet Condition: stable Instructions: refer to practice specific booklet Discharge to: home   Newborn Data: Live born female  Birth Weight: 5 lb 15.4 oz (2705 g) APGAR: 8, 9  Home with mother.  Ardis Hughs 01/13/2013, 7:15 AM  I examined pt and agree with documentation above and PA-S plan of care. [facility]

## 2013-01-24 ENCOUNTER — Encounter: Payer: Self-pay | Admitting: Obstetrics and Gynecology

## 2013-01-24 ENCOUNTER — Ambulatory Visit (INDEPENDENT_AMBULATORY_CARE_PROVIDER_SITE_OTHER): Payer: Medicaid Other | Admitting: Obstetrics and Gynecology

## 2013-01-24 VITALS — BP 110/78 | Ht 64.0 in | Wt 111.2 lb

## 2013-01-24 DIAGNOSIS — Z9889 Other specified postprocedural states: Secondary | ICD-10-CM

## 2013-01-24 DIAGNOSIS — Z09 Encounter for follow-up examination after completed treatment for conditions other than malignant neoplasm: Secondary | ICD-10-CM

## 2013-01-24 MED ORDER — HYDROCODONE-ACETAMINOPHEN 5-325 MG PO TABS: 1.0000 | ORAL_TABLET | Freq: Four times a day (QID) | ORAL | Status: DC | PRN

## 2013-01-24 NOTE — Progress Notes (Signed)
Patient ID: Autumn Herrera, female   DOB: Nov 16, 1985, 27 y.o.   MRN: [id number] Pt here for post op visit, c-section 01/11/2013. No problems or complaints at this time. Pt is breastfeeding.  Assessment:  Post-Op status post Cesarean section for Repeat cesarean.2 weeks ago.  Doing well postoperatively. Contraception: Depo-Provera t to receive it at 4 weeks postpartum   Plan:  1. Continue any current medications. Will discontinue Percocet, switch to hydrocodone 2. Wound care discussed. 3. Activity restrictions: 25 pound weight limit 4. Anticipated return to work: 4 weeks. 5. Follow up in 4 week. Finally exam and Depo-Provera shot  Subjective:  Autumn Herrera is a 27 y.o. female who presents to the clinic 2 weeks status post Cesarean section.  She has been eating a regular diet without difficulty.  Bowel movements are normal. Pain is controlled with current analgesics. Medications being used: Percocet. We will reduce to hydrocodone and future.  Review of Systems Negative except as per HPI   Objective:  BP 110/78  Ht 5\' 4"  (1.626 m)  Wt 111 lb 3.2 oz (50.44 kg)  BMI 19.08 kg/m2  LMP 04/12/2012  Breastfeeding? Yes General:Well developed, well nourished.  No acute distress. Abdomen: Bowel sounds normal, soft, non-tender. Well-healed surgical incision site. Patient able to lie back supine easily while hold the baby Incision well approximated

## 2013-01-24 NOTE — Patient Instructions (Addendum)
Please Avoid sexual activity and until Depo-Provera obtained history/partum visit. If sexual activity is decided upon, please use condoms.. The Depo-Provera shot will be given at her next visit

## 2013-02-21 ENCOUNTER — Ambulatory Visit: Payer: Medicaid Other | Admitting: Women's Health

## 2013-02-24 ENCOUNTER — Ambulatory Visit (INDEPENDENT_AMBULATORY_CARE_PROVIDER_SITE_OTHER): Payer: Medicaid Other | Admitting: Adult Health

## 2013-02-24 ENCOUNTER — Encounter: Payer: Self-pay | Admitting: Adult Health

## 2013-02-24 DIAGNOSIS — Z309 Encounter for contraceptive management, unspecified: Secondary | ICD-10-CM

## 2013-02-24 DIAGNOSIS — Z1331 Encounter for screening for depression: Secondary | ICD-10-CM

## 2013-02-24 MED ORDER — MEDROXYPROGESTERONE ACETATE 150 MG/ML IM SUSP: 150.0000 mg | INTRAMUSCULAR | Status: AC

## 2013-02-24 NOTE — Progress Notes (Signed)
Patient ID: Autumn Herrera, female   DOB: 03/30/1986, 27 y.o.   MRN: [id number] Autumn Herrera is a 27 year old white female in for her postpartum check. Delivery Date:01/11/13 Method of Delivery: repeat C-section by Dr Emelda Fear  Sexual Activity since delivery: Yes no condom  Method of Feeding: Bottle now, tired breast x 1 month  Number of weeks bleeding post delivery: 2 weeks  Review of Systems: Patient denies any headaches, blurred vision, shortness of breath, chest pain, abdominal pain, problems with bowel movements, urination, or intercourse. No mood changes, no joint pain Reviewed past medical,surgical, social and family history. Reviewed medications and allergies.  Depression Score: 1  Pelvic Exam:   External genitalia is normal in appearance.  The vagina is normal in appearance. The cervix is bulbous.  Uterus is felt to be normal size, shape, and contour, well involuted.  No adnexal masses or tenderness noted. Abdomen non tender and scar looks good. BP 110/70  Ht 5\' 4"  (1.626 m)  Wt 110 lb (49.896 kg)  BMI 18.87 kg/m2  LMP 02/14/2013  Breastfeeding? Nolast sex before period  Impression:  Status post delivery, post partum check, depression screening, contraceptive management.   Plan:   No sex RX Depo provera 150 mg for IM injection in office every 3 months with 4 refills Come in am and get QHCG stat and then in pm for shot Physical in 1 year

## 2013-02-24 NOTE — Patient Instructions (Addendum)
NO SEX til after depo Come back in am for [facility] and get depo in pm Physical in 1 year

## 2013-02-25 ENCOUNTER — Encounter: Payer: Self-pay | Admitting: Adult Health

## 2013-02-25 ENCOUNTER — Ambulatory Visit (INDEPENDENT_AMBULATORY_CARE_PROVIDER_SITE_OTHER): Payer: Medicaid Other | Admitting: Adult Health

## 2013-02-25 ENCOUNTER — Other Ambulatory Visit: Payer: Medicaid Other

## 2013-02-25 VITALS — BP 80/60 | Ht 64.0 in | Wt 109.0 lb

## 2013-02-25 DIAGNOSIS — Z309 Encounter for contraceptive management, unspecified: Secondary | ICD-10-CM

## 2013-02-25 DIAGNOSIS — Z3049 Encounter for surveillance of other contraceptives: Secondary | ICD-10-CM

## 2013-02-25 DIAGNOSIS — Z3200 Encounter for pregnancy test, result unknown: Secondary | ICD-10-CM

## 2013-02-25 MED ORDER — MEDROXYPROGESTERONE ACETATE 150 MG/ML IM SUSP
150.0000 mg | Freq: Once | INTRAMUSCULAR | Status: AC
  Administered 2013-02-25: 150 mg via INTRAMUSCULAR

## 2013-02-25 NOTE — Progress Notes (Signed)
Pt here for Depo Provera. No complaints at this time. Next Depo in 12 weeks. JSY

## 2013-03-01 ENCOUNTER — Emergency Department (HOSPITAL_COMMUNITY)
Admission: EM | Admit: 2013-03-01 | Discharge: 2013-03-01 | Disposition: A | Discharge status: Home / Self Care | Payer: Medicaid Other | Attending: Emergency Medicine | Admitting: Emergency Medicine

## 2013-03-01 ENCOUNTER — Encounter (HOSPITAL_COMMUNITY): Payer: Self-pay | Admitting: *Deleted

## 2013-03-01 DIAGNOSIS — M5431 Sciatica, right side: Secondary | ICD-10-CM

## 2013-03-01 DIAGNOSIS — Z8742 Personal history of other diseases of the female genital tract: Secondary | ICD-10-CM | POA: Insufficient documentation

## 2013-03-01 DIAGNOSIS — Z880 Allergy status to penicillin: Secondary | ICD-10-CM | POA: Insufficient documentation

## 2013-03-01 DIAGNOSIS — Z8659 Personal history of other mental and behavioral disorders: Secondary | ICD-10-CM | POA: Insufficient documentation

## 2013-03-01 DIAGNOSIS — Z8632 Personal history of gestational diabetes: Secondary | ICD-10-CM | POA: Insufficient documentation

## 2013-03-01 DIAGNOSIS — O26899 Other specified pregnancy related conditions, unspecified trimester: Secondary | ICD-10-CM | POA: Insufficient documentation

## 2013-03-01 DIAGNOSIS — O909 Complication of the puerperium, unspecified: Secondary | ICD-10-CM | POA: Insufficient documentation

## 2013-03-01 DIAGNOSIS — Z9889 Other specified postprocedural states: Secondary | ICD-10-CM | POA: Insufficient documentation

## 2013-03-01 DIAGNOSIS — O99335 Smoking (tobacco) complicating the puerperium: Secondary | ICD-10-CM | POA: Insufficient documentation

## 2013-03-01 DIAGNOSIS — Z8739 Personal history of other diseases of the musculoskeletal system and connective tissue: Secondary | ICD-10-CM | POA: Insufficient documentation

## 2013-03-01 DIAGNOSIS — M545 Low back pain, unspecified: Secondary | ICD-10-CM | POA: Insufficient documentation

## 2013-03-01 DIAGNOSIS — M5430 Sciatica, unspecified side: Secondary | ICD-10-CM | POA: Insufficient documentation

## 2013-03-01 DIAGNOSIS — Z87440 Personal history of urinary (tract) infections: Secondary | ICD-10-CM | POA: Insufficient documentation

## 2013-03-01 DIAGNOSIS — IMO0002 Reserved for concepts with insufficient information to code with codable children: Secondary | ICD-10-CM | POA: Insufficient documentation

## 2013-03-01 DIAGNOSIS — Z91040 Latex allergy status: Secondary | ICD-10-CM | POA: Insufficient documentation

## 2013-03-01 DIAGNOSIS — G8929 Other chronic pain: Secondary | ICD-10-CM | POA: Insufficient documentation

## 2013-03-01 DIAGNOSIS — M25559 Pain in unspecified hip: Secondary | ICD-10-CM | POA: Insufficient documentation

## 2013-03-01 DIAGNOSIS — Z79899 Other long term (current) drug therapy: Secondary | ICD-10-CM | POA: Insufficient documentation

## 2013-03-01 DIAGNOSIS — J45909 Unspecified asthma, uncomplicated: Secondary | ICD-10-CM | POA: Insufficient documentation

## 2013-03-01 MED ORDER — METHOCARBAMOL 500 MG PO TABS: 1000.0000 mg | ORAL_TABLET | Freq: Four times a day (QID) | ORAL | Status: AC

## 2013-03-01 MED ORDER — OXYCODONE-ACETAMINOPHEN 5-325 MG PO TABS
1.0000 | ORAL_TABLET | Freq: Once | ORAL | Status: AC
  Administered 2013-03-01: 1 via ORAL
  Filled 2013-03-01: qty 1

## 2013-03-01 MED ORDER — METHOCARBAMOL 500 MG PO TABS
1000.0000 mg | ORAL_TABLET | Freq: Once | ORAL | Status: AC
  Administered 2013-03-01: 1000 mg via ORAL
  Filled 2013-03-01: qty 2

## 2013-03-01 MED ORDER — OXYCODONE-ACETAMINOPHEN 5-325 MG PO TABS: 1.0000 | ORAL_TABLET | ORAL | Status: DC | PRN

## 2013-03-01 MED ORDER — IBUPROFEN 800 MG PO TABS
800.0000 mg | ORAL_TABLET | Freq: Once | ORAL | Status: AC
  Administered 2013-03-01: 800 mg via ORAL
  Filled 2013-03-01: qty 1

## 2013-03-01 MED ORDER — IBUPROFEN 600 MG PO TABS: 600.0000 mg | ORAL_TABLET | Freq: Four times a day (QID) | ORAL | Status: DC | PRN

## 2013-03-01 NOTE — ED Notes (Signed)
C/o lower pain onset 30 min ago after sitting down in chair.  States heard "pop".  Denies injury.

## 2013-03-02 NOTE — ED Provider Notes (Signed)
Medical screening examination/treatment/procedure(s) were performed by non-physician practitioner and as supervising physician I was immediately available for consultation/collaboration.   Liem Copenhaver M Kimoni Pickerill, DO 03/02/13 1710 

## 2013-03-02 NOTE — ED Provider Notes (Signed)
CSN: [id number]     Arrival date & time 03/01/13  1944 History     First MD Initiated Contact with Patient 03/01/13 2012     Chief Complaint  Patient presents with  . Back Pain   (Consider location/radiation/quality/duration/timing/severity/associated sxs/prior Treatment) HPI Comments: Autumn Herrera is a 27 y.o. Female presenting with acute on chronic low back pain which has which has been present for the past hour.   Patient denies any new injury specifically, stating she was lowering herself into a chair when she felt sudden onset of sharp pain in her right lower back with radiation into her right lower thigh.  She has a history of low back pain and has a known small lumbar disk herniation which was diagnosed by MRI while living in [place].   There has been no weakness or numbness in the lower extremities and no urinary or bowel retention or incontinence.  Patient does not have a history of cancer or IVDU. She has taken no medicines prior to arrival.   She is 6 weeks post partum.  She denies fevers or chills and is not breast feeding.   The history is provided by the patient and the spouse.    Past Medical History  Diagnosis Date  . Degenerative disc disease, lumbar   . Disc herniation   . Pregnant   . Asthma   . Depression   . DM (diabetes mellitus), gestational 2006    on insulin  . History of cesarean delivery, currently pregnant 11/08/2012  . Gestational diabetes   . UTI (lower urinary tract infection)    Past Surgical History  Procedure Laterality Date  . Knee surgery    . Cesarean section    . Dilation and curettage of uterus N/A 2010  . Cesarean section N/A 01/11/2013    Procedure: REPEAT CESAREAN SECTION;  Surgeon: Tilda Burrow, MD;  Location: [facility];  Service: Obstetrics;  Laterality: N/A;   Family History  Problem Relation Age of Onset  . Diabetes Paternal Grandfather   . Hypertension Paternal Grandfather   . Stroke Paternal Grandfather   . Hypertension  Paternal Grandmother   . Diabetes Maternal Grandmother   . Hypertension Maternal Grandmother   . Cancer Maternal Grandmother     breast  . Diabetes Maternal Grandfather   . Hypertension Maternal Grandfather   . CAD Maternal Grandfather   . Hypertension Mother   . Cancer Mother     cervical   History  Substance Use Topics  . Smoking status: Current Every Day Smoker -- 1.00 packs/day for 11 years    Types: Cigarettes  . Smokeless tobacco: Never Used  . Alcohol Use: No   OB History   Grav Para Term Preterm Abortions TAB SAB Ect Mult Living   4 3 3  1  1   3      Review of Systems  Constitutional: Negative for fever.  Respiratory: Negative for shortness of breath.   Cardiovascular: Negative for chest pain and leg swelling.  Gastrointestinal: Negative for abdominal pain, constipation and abdominal distention.  Genitourinary: Negative for dysuria, urgency, frequency, flank pain and difficulty urinating.  Musculoskeletal: Positive for back pain. Negative for joint swelling and gait problem.  Skin: Negative for color change and rash.  Neurological: Negative for weakness and numbness.    Allergies  Penicillins; Hydrocodone; Latex; and Pitocin  Home Medications   Current Outpatient Rx  Name  Route  Sig  Dispense  Refill  . ibuprofen (ADVIL,MOTRIN) 600 MG  tablet   Oral   Take 1 tablet (600 mg total) by mouth every 6 (six) hours.   30 tablet   0   . medroxyPROGESTERone (DEPO-PROVERA) 150 MG/ML injection   Intramuscular   Inject 1 mL (150 mg total) into the muscle every 3 (three) months.   1 mL   4   . Prenatal Vit-Fe Fumarate-FA (PRENATAL MULTIVITAMIN) TABS   Oral   Take 1 tablet by mouth daily.          Marland Kitchen ibuprofen (ADVIL,MOTRIN) 600 MG tablet   Oral   Take 1 tablet (600 mg total) by mouth every 6 (six) hours as needed for pain.   30 tablet   0   . methocarbamol (ROBAXIN) 500 MG tablet   Oral   Take 2 tablets (1,000 mg total) by mouth 4 (four) times daily.    40 tablet   0   . oxyCODONE-acetaminophen (PERCOCET/ROXICET) 5-325 MG per tablet   Oral   Take 1 tablet by mouth every 4 (four) hours as needed for pain.   20 tablet   0    BP 104/78  Pulse 91  Temp(Src) 98.4 F (36.9 C) (Oral)  Resp 23  Ht 5\' 4"  (1.626 m)  Wt 110 lb (49.896 kg)  BMI 18.87 kg/m2  SpO2 100%  LMP 02/14/2013  Breastfeeding? Yes Physical Exam  Nursing note and vitals reviewed. Constitutional: She appears well-developed and well-nourished.  HENT:  Head: Normocephalic.  Eyes: Conjunctivae are normal.  Neck: Normal range of motion. Neck supple.  Cardiovascular: Normal rate and intact distal pulses.   Pedal pulses normal.  Pulmonary/Chest: Effort normal.  Abdominal: Soft. Bowel sounds are normal. She exhibits no distension and no mass.  Musculoskeletal: Normal range of motion. She exhibits tenderness. She exhibits no edema.       Lumbar back: She exhibits tenderness and spasm. She exhibits no bony tenderness, no swelling and no edema.  Right paralumbar ttp,  No si joint ttp.    Neurological: She is alert. She has normal strength. She displays no atrophy and no tremor. No sensory deficit. Gait normal.  Reflex Scores:      Patellar reflexes are 2+ on the right side and 2+ on the left side.      Achilles reflexes are 2+ on the right side and 2+ on the left side. No strength deficit noted in hip and knee flexor and extensor muscle groups.  Ankle flexion and extension intact.  Skin: Skin is warm and dry.  Psychiatric: She has a normal mood and affect.    ED Course   Procedures (including critical care time)  Labs Reviewed - No data to display No results found. 1. Sciatica, right     MDM  No neuro deficit on exam or by history to suggest emergent or surgical presentation.  Also discussed worsened sx that should prompt immediate re-evaluation including distal weakness, bowel/bladder retention/incontinence. Pt was prescribed ibuprofen, robaxin, oxycodone.   Referrals given for ortho recheck of sx.          Burgess Amor, PA-C 03/02/13 [phone]

## 2013-04-14 ENCOUNTER — Encounter: Payer: Self-pay | Admitting: Adult Health

## 2013-04-14 ENCOUNTER — Ambulatory Visit (INDEPENDENT_AMBULATORY_CARE_PROVIDER_SITE_OTHER): Payer: Medicaid Other | Admitting: Adult Health

## 2013-04-14 VITALS — BP 110/70 | Ht 64.0 in | Wt 110.0 lb

## 2013-04-14 DIAGNOSIS — B029 Zoster without complications: Secondary | ICD-10-CM | POA: Insufficient documentation

## 2013-04-14 HISTORY — DX: Zoster without complications: B02.9

## 2013-04-14 MED ORDER — OXYCODONE-ACETAMINOPHEN 5-325 MG PO TABS: 1.0000 | ORAL_TABLET | ORAL | Status: DC | PRN

## 2013-04-14 MED ORDER — ACYCLOVIR 800 MG PO TABS: ORAL_TABLET | ORAL | Status: DC

## 2013-04-14 NOTE — Patient Instructions (Addendum)
Shingles Shingles (herpes zoster) is an infection that is caused by the same virus that causes chickenpox (varicella). The infection causes a painful skin rash and fluid-filled blisters, which eventually break open, crust over, and heal. It may occur in any area of the body, but it usually affects only one side of the body or face. The pain of shingles usually lasts about 1 month. However, some people with shingles may develop long-term (chronic) pain in the affected area of the body. Shingles often occurs many years after the person had chickenpox. It is more common:  In people older than 50 years.  In people with weakened immune systems, such as those with HIV, AIDS, or cancer.  In people taking medicines that weaken the immune system, such as tranfollow up in 2 weekssplant medicines.  In people under great stress. CAUSES  Shingles is caused by the varicella zoster virus (VZV), which also causes chickenpox. After a person is infected with the virus, it can remain in the person's body for years in an inactive state (dormant). To cause shingles, the virus reactivates and breaks out as an infection in a nerve root. The virus can be spread from person to person (contagious) through contact with open blisters of the shingles rash. It will only spread to people who have not had chickenpox. When these people are exposed to the virus, they may develop chickenpox. They will not develop shingles. Once the blisters scab over, the person is no longer contagious and cannot spread the virus to others. SYMPTOMS  Shingles shows up in stages. The initial symptoms may be pain, itching, and tingling in an area of the skin. This pain is usually described as burning, stabbing, or throbbing.In a few days or weeks, a painful red rash will appear in the area where the pain, itching, and tingling were felt. The rash is usually on one side of the body in a band or belt-like pattern. Then, the rash usually turns into  fluid-filled blisters. They will scab over and dry up in approximately 2 3 weeks. Flu-like symptoms may also occur with the initial symptoms, the rash, or the blisters. These may include:  Fever.  Chills.  Headache.  Upset stomach. DIAGNOSIS  Your caregiver will perform a skin exam to diagnose shingles. Skin scrapings or fluid samples may also be taken from the blisters. This sample will be examined under a microscope or sent to a lab for further testing. TREATMENT  There is no specific cure for shingles. Your caregiver will likely prescribe medicines to help you manage the pain, recover faster, and avoid long-term problems. This may include antiviral drugs, anti-inflammatory drugs, and pain medicines. HOME CARE INSTRUCTIONS   Take a cool bath or apply cool compresses to the area of the rash or blisters as directed. This may help with the pain and itching.   Only take over-the-counter or prescription medicines as directed by your caregiver.   Rest as directed by your caregiver.  Keep your rash and blisters clean with mild soap and cool water or as directed by your caregiver.  Do not pick your blisters or scratch your rash. Apply an anti-itch cream or numbing creams to the affected area as directed by your caregiver.  Keep your shingles rash covered with a loose bandage (dressing).  Avoid skin contact with:  Babies.   Pregnant women.   Children with eczema.   Elderly people with transplants.   People with chronic illnesses, such as leukemia or AIDS.   Wear loose-fitting  clothing to help ease the pain of material rubbing against the rash.  Keep all follow-up appointments with your caregiver.If the area involved is on your face, you may receive a referral for follow-up to a specialist, such as an eye doctor (ophthalmologist) or an ear, nose, and throat (ENT) doctor. Keeping all follow-up appointments will help you avoid eye complications, chronic pain, or disability.   SEEK IMMEDIATE MEDICAL CARE IF:   You have facial pain, pain around the eye area, or loss of feeling on one side of your face.  You have ear pain or ringing in your ear.  You have loss of taste.  Your pain is not relieved with prescribed medicines.   Your redness or swelling spreads.   You have more pain and swelling.  Your condition is worsening or has changed.   You have a feveror persistent symptoms for more than 2 3 days.  You have a fever and your symptoms suddenly get worse. MAKE SURE YOU:  Understand these instructions.  Will watch your condition.  Will get help right away if you are not doing well or get worse. Document Released: 07/21/2005 Document Revised: 04/14/2012 Document Reviewed: 03/04/2012 [facility] Patient Information 2014 [place], [place]. Follow up in 2 weeks Take meds as directed

## 2013-04-14 NOTE — Progress Notes (Signed)
Subjective:     Patient ID: Autumn Herrera, female   DOB: 1986-03-06, 27 y.o.   MRN: [id number]  HPI Hae is in complaining of a rash that started Sunday and it itches and hurts and she has knots in the groin area.She noticed she has felt discomfort for about 2 weeks or so.She said C-section scar had open area that drained.  Review of Systems Positives in HPI   Reviewed past medical,surgical, social and family history. Reviewed medications and allergies.  Objective:   Physical Exam BP 110/70  Ht 5\' 4"  (1.626 m)  Wt 110 lb (49.896 kg)  BMI 18.87 kg/m2  LMP 03/14/2013  Breastfeeding? No   Has lesions on low abdomen top of right thigh that have been scratched and denuded, and has bilateral swollen lymph nodes, right>left, C-section incision closed, no drainage. Assessment:     Shingles    Plan:     Rx acyclovir 800 mg #50 take 1 five x daily x 10 days no refills Rx percocet 5/325 mg # 20 1 every 4 hours prn pain no refills Review handout on shingles Follow up in 2 weeks   Do not shave this area

## 2013-05-02 ENCOUNTER — Ambulatory Visit: Payer: Medicaid Other | Admitting: Adult Health

## 2013-05-19 ENCOUNTER — Encounter (INDEPENDENT_AMBULATORY_CARE_PROVIDER_SITE_OTHER): Payer: Self-pay

## 2013-05-19 ENCOUNTER — Encounter: Payer: Self-pay | Admitting: Obstetrics & Gynecology

## 2013-05-19 ENCOUNTER — Ambulatory Visit (INDEPENDENT_AMBULATORY_CARE_PROVIDER_SITE_OTHER): Payer: Medicaid Other | Admitting: Obstetrics & Gynecology

## 2013-05-19 VITALS — BP 100/70 | Ht 64.0 in | Wt 107.0 lb

## 2013-05-19 DIAGNOSIS — Z3049 Encounter for surveillance of other contraceptives: Secondary | ICD-10-CM

## 2013-05-19 DIAGNOSIS — Z3202 Encounter for pregnancy test, result negative: Secondary | ICD-10-CM

## 2013-05-19 MED ORDER — MEDROXYPROGESTERONE ACETATE 150 MG/ML IM SUSP
150.0000 mg | Freq: Once | INTRAMUSCULAR | Status: AC
  Administered 2013-05-19: 150 mg via INTRAMUSCULAR

## 2013-05-19 NOTE — Progress Notes (Signed)
Pt here for Depo shot. Not having any problems or concerns. Pt to return in 12 weeks for next Depo. JSY

## 2013-05-28 ENCOUNTER — Emergency Department (HOSPITAL_COMMUNITY): Payer: Medicaid Other

## 2013-05-28 ENCOUNTER — Encounter (HOSPITAL_COMMUNITY): Payer: Self-pay | Admitting: Emergency Medicine

## 2013-05-28 ENCOUNTER — Emergency Department (HOSPITAL_COMMUNITY)
Admission: EM | Admit: 2013-05-28 | Discharge: 2013-05-28 | Disposition: A | Discharge status: Home / Self Care | Payer: Medicaid Other | Attending: Emergency Medicine | Admitting: Emergency Medicine

## 2013-05-28 DIAGNOSIS — Z8659 Personal history of other mental and behavioral disorders: Secondary | ICD-10-CM | POA: Insufficient documentation

## 2013-05-28 DIAGNOSIS — Z8632 Personal history of gestational diabetes: Secondary | ICD-10-CM | POA: Insufficient documentation

## 2013-05-28 DIAGNOSIS — S4980XA Other specified injuries of shoulder and upper arm, unspecified arm, initial encounter: Secondary | ICD-10-CM | POA: Insufficient documentation

## 2013-05-28 DIAGNOSIS — Z87440 Personal history of urinary (tract) infections: Secondary | ICD-10-CM | POA: Insufficient documentation

## 2013-05-28 DIAGNOSIS — F17200 Nicotine dependence, unspecified, uncomplicated: Secondary | ICD-10-CM | POA: Insufficient documentation

## 2013-05-28 DIAGNOSIS — Z880 Allergy status to penicillin: Secondary | ICD-10-CM | POA: Insufficient documentation

## 2013-05-28 DIAGNOSIS — Z91040 Latex allergy status: Secondary | ICD-10-CM | POA: Insufficient documentation

## 2013-05-28 DIAGNOSIS — W19XXXA Unspecified fall, initial encounter: Secondary | ICD-10-CM

## 2013-05-28 DIAGNOSIS — S139XXA Sprain of joints and ligaments of unspecified parts of neck, initial encounter: Secondary | ICD-10-CM | POA: Insufficient documentation

## 2013-05-28 DIAGNOSIS — S8990XA Unspecified injury of unspecified lower leg, initial encounter: Secondary | ICD-10-CM | POA: Insufficient documentation

## 2013-05-28 DIAGNOSIS — Z8739 Personal history of other diseases of the musculoskeletal system and connective tissue: Secondary | ICD-10-CM | POA: Insufficient documentation

## 2013-05-28 DIAGNOSIS — Z8619 Personal history of other infectious and parasitic diseases: Secondary | ICD-10-CM | POA: Insufficient documentation

## 2013-05-28 DIAGNOSIS — W11XXXA Fall on and from ladder, initial encounter: Secondary | ICD-10-CM | POA: Insufficient documentation

## 2013-05-28 DIAGNOSIS — IMO0002 Reserved for concepts with insufficient information to code with codable children: Secondary | ICD-10-CM | POA: Insufficient documentation

## 2013-05-28 DIAGNOSIS — Y9389 Activity, other specified: Secondary | ICD-10-CM | POA: Insufficient documentation

## 2013-05-28 DIAGNOSIS — J45909 Unspecified asthma, uncomplicated: Secondary | ICD-10-CM | POA: Insufficient documentation

## 2013-05-28 DIAGNOSIS — Y92009 Unspecified place in unspecified non-institutional (private) residence as the place of occurrence of the external cause: Secondary | ICD-10-CM | POA: Insufficient documentation

## 2013-05-28 DIAGNOSIS — S46909A Unspecified injury of unspecified muscle, fascia and tendon at shoulder and upper arm level, unspecified arm, initial encounter: Secondary | ICD-10-CM | POA: Insufficient documentation

## 2013-05-28 DIAGNOSIS — S161XXA Strain of muscle, fascia and tendon at neck level, initial encounter: Secondary | ICD-10-CM

## 2013-05-28 MED ORDER — TRAMADOL HCL 50 MG PO TABS: ORAL_TABLET | ORAL | Status: DC

## 2013-05-28 MED ORDER — OXYCODONE-ACETAMINOPHEN 5-325 MG PO TABS
1.0000 | ORAL_TABLET | Freq: Once | ORAL | Status: AC
  Administered 2013-05-28: 1 via ORAL
  Filled 2013-05-28: qty 1

## 2013-05-28 MED ORDER — HYDROCODONE-ACETAMINOPHEN 5-325 MG PO TABS
1.0000 | ORAL_TABLET | Freq: Once | ORAL | Status: DC
  Filled 2013-05-28: qty 1

## 2013-05-28 NOTE — ED Notes (Signed)
Pt alert & oriented x4, stable gait. Patient given discharge instructions, paperwork & prescription(s). Patient  instructed to stop at the registration desk to finish any additional paperwork. Patient verbalized understanding. Pt left department w/ no further questions. 

## 2013-05-28 NOTE — ED Provider Notes (Signed)
CSN: [id number]     Arrival date & time 05/28/13  1055 History  This chart was scribed for Benny Lennert, MD by Bennett Scrape, ED Scribe. This patient was seen in room APA10/APA10 and the patient's care was started at 11:26 AM.   Chief Complaint  Patient presents with  . Fall    Patient is a 27 y.o. female presenting with fall. The history is provided by the patient. No language interpreter was used.  Fall This is a new problem. The current episode started less than 1 hour ago. Episode frequency: once. The problem has been resolved. Pertinent negatives include no chest pain, no abdominal pain and no headaches. The symptoms are aggravated by walking and standing (associated pains are ). The symptoms are relieved by rest. She has tried nothing for the symptoms.    HPI Comments: Autumn Herrera is a 27 y.o. female who presents to the Emergency Department in a c-collar complaining of a fall off of a ladder that occurred PTA. Pt states that she was 6 feet off of the ground hanging Halloween decorations next to her porch when her husband stepped away from holding the ladder. She states that she lost her balance and she fell onto conncrete. She c/o associated left shoulder, neck, lower back and right knee pain all described as soreness. She denies decreased ROM secondary to pain. She denies head trauma or LOC.   Past Medical History  Diagnosis Date  . Degenerative disc disease, lumbar   . Disc herniation   . Pregnant   . Asthma   . Depression   . DM (diabetes mellitus), gestational 2006    on insulin  . History of cesarean delivery, currently pregnant 11/08/2012  . Gestational diabetes   . UTI (lower urinary tract infection)   . Shingles 04/14/2013   Past Surgical History  Procedure Laterality Date  . Knee surgery    . Cesarean section    . Dilation and curettage of uterus N/A 2010  . Cesarean section N/A 01/11/2013    Procedure: REPEAT CESAREAN SECTION;  Surgeon: Tilda Burrow,  MD;  Location: [facility];  Service: Obstetrics;  Laterality: N/A;   Family History  Problem Relation Age of Onset  . Diabetes Paternal Grandfather   . Hypertension Paternal Grandfather   . Stroke Paternal Grandfather   . Hypertension Paternal Grandmother   . Diabetes Maternal Grandmother   . Hypertension Maternal Grandmother   . Cancer Maternal Grandmother     breast  . Diabetes Maternal Grandfather   . Hypertension Maternal Grandfather   . CAD Maternal Grandfather   . Hypertension Mother   . Cancer Mother     cervical   History  Substance Use Topics  . Smoking status: Current Every Day Smoker -- 1.00 packs/day for 11 years    Types: Cigarettes  . Smokeless tobacco: Never Used  . Alcohol Use: No   OB History   Grav Para Term Preterm Abortions TAB SAB Ect Mult Living   4 3 3  1  1   3      Review of Systems  Constitutional: Negative for appetite change and fatigue.  HENT: Negative for congestion, ear discharge and sinus pressure.   Eyes: Negative for discharge.  Respiratory: Negative for cough.   Cardiovascular: Negative for chest pain.  Gastrointestinal: Negative for abdominal pain and diarrhea.  Genitourinary: Negative for frequency and hematuria.  Musculoskeletal: Positive for arthralgias, back pain and neck pain.  Skin: Negative for rash and wound.  Neurological: Negative for seizures and headaches.  Psychiatric/Behavioral: Negative for hallucinations.    Allergies  Penicillins; Hydrocodone; Latex; and Pitocin  Home Medications   Current Outpatient Rx  Name  Route  Sig  Dispense  Refill  . acetaminophen (TYLENOL) 500 MG tablet   Oral   Take 500 mg by mouth every 6 (six) hours as needed for pain.         . medroxyPROGESTERone (DEPO-PROVERA) 150 MG/ML injection   Intramuscular   Inject 1 mL (150 mg total) into the muscle every 3 (three) months.   1 mL   4    Triage Vitals: BP 114/71  Pulse 107  Temp(Src) 98.1 F (36.7 C) (Oral)  Resp 20  SpO2 99%   LMP 05/16/2013  Physical Exam  Nursing note and vitals reviewed. Constitutional: She is oriented to person, place, and time. She appears well-developed and well-nourished.  HENT:  Head: Normocephalic and atraumatic.  Eyes: Conjunctivae are normal.  Neck: No tracheal deviation present.  Tenderness to posterior neck and left trapezius muscle   Cardiovascular: Normal rate.   Pulmonary/Chest: Effort normal.  Musculoskeletal: Normal range of motion. She exhibits tenderness.  Tenderness over right tibia and fibula, no deformity noted  Neurological: She is alert and oriented to person, place, and time.  Skin: Skin is warm and dry.  Psychiatric: She has a normal mood and affect. Her behavior is normal.    ED Course  Procedures (including critical care time)  DIAGNOSTIC STUDIES: Oxygen Saturation is 99% on room air, normal by my interpretation.    COORDINATION OF CARE: 11:30 AM-Discussed treatment plan which includes CT head, CT of c-spine, x-ray of right leg and CXR with pt at bedside and pt agreed to plan. C-collar precautions will be upheld.   Labs Review Labs Reviewed - No data to display Imaging Review No results found.  EKG Interpretation   None       MDM  No diagnosis found.   The chart was scribed for me under my direct supervision.  I personally performed the history, physical, and medical decision making and all procedures in the evaluation of this patient.Benny Lennert, MD 05/28/13 1314

## 2013-05-28 NOTE — ED Notes (Addendum)
Pt states that she was standing on top of a ladder this am  when her husband let go of the ladder it became unsteady causing her to fall, pt states that she fell approximately 6 feet, denies any LOC, states that she hit her head, c/o pain to back of head, neck, middle and lower back, left shoulder, and pain from right knee that radiates down right leg. Cms intact all extremities. Pt ambulatory to triage, no problems noted.

## 2013-08-09 ENCOUNTER — Emergency Department (HOSPITAL_COMMUNITY)
Admission: EM | Admit: 2013-08-09 | Discharge: 2013-08-09 | Disposition: A | Discharge status: Home / Self Care | Payer: Medicaid Other | Attending: Emergency Medicine | Admitting: Emergency Medicine

## 2013-08-09 ENCOUNTER — Encounter (HOSPITAL_COMMUNITY): Payer: Self-pay | Admitting: Emergency Medicine

## 2013-08-09 DIAGNOSIS — Z79899 Other long term (current) drug therapy: Secondary | ICD-10-CM | POA: Insufficient documentation

## 2013-08-09 DIAGNOSIS — G8911 Acute pain due to trauma: Secondary | ICD-10-CM | POA: Insufficient documentation

## 2013-08-09 DIAGNOSIS — Z87440 Personal history of urinary (tract) infections: Secondary | ICD-10-CM | POA: Insufficient documentation

## 2013-08-09 DIAGNOSIS — F17200 Nicotine dependence, unspecified, uncomplicated: Secondary | ICD-10-CM | POA: Insufficient documentation

## 2013-08-09 DIAGNOSIS — Z880 Allergy status to penicillin: Secondary | ICD-10-CM | POA: Insufficient documentation

## 2013-08-09 DIAGNOSIS — Z8619 Personal history of other infectious and parasitic diseases: Secondary | ICD-10-CM | POA: Insufficient documentation

## 2013-08-09 DIAGNOSIS — J45909 Unspecified asthma, uncomplicated: Secondary | ICD-10-CM | POA: Insufficient documentation

## 2013-08-09 DIAGNOSIS — Z8659 Personal history of other mental and behavioral disorders: Secondary | ICD-10-CM | POA: Insufficient documentation

## 2013-08-09 DIAGNOSIS — M79609 Pain in unspecified limb: Secondary | ICD-10-CM | POA: Insufficient documentation

## 2013-08-09 DIAGNOSIS — M549 Dorsalgia, unspecified: Secondary | ICD-10-CM

## 2013-08-09 DIAGNOSIS — M545 Low back pain, unspecified: Secondary | ICD-10-CM | POA: Insufficient documentation

## 2013-08-09 DIAGNOSIS — Z91040 Latex allergy status: Secondary | ICD-10-CM | POA: Insufficient documentation

## 2013-08-09 MED ORDER — KETOROLAC TROMETHAMINE 60 MG/2ML IM SOLN
60.0000 mg | Freq: Once | INTRAMUSCULAR | Status: AC
  Administered 2013-08-09: 60 mg via INTRAMUSCULAR
  Filled 2013-08-09: qty 2

## 2013-08-09 MED ORDER — PREDNISONE 20 MG PO TABS: ORAL_TABLET | ORAL | Status: DC

## 2013-08-09 MED ORDER — NAPROXEN 500 MG PO TABS: 500.0000 mg | ORAL_TABLET | Freq: Two times a day (BID) | ORAL | Status: DC
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
## 2013-08-09 NOTE — Discharge Instructions (Signed)
Medication for pain and inflammation. Ice pack to back. Followup with orthopedic Dr.   Phone number given

## 2013-08-11 ENCOUNTER — Ambulatory Visit: Payer: Medicaid Other

## 2013-08-12 ENCOUNTER — Ambulatory Visit: Payer: Medicaid Other

## 2013-08-23 ENCOUNTER — Encounter: Payer: Self-pay | Admitting: Adult Health

## 2013-08-23 ENCOUNTER — Ambulatory Visit (INDEPENDENT_AMBULATORY_CARE_PROVIDER_SITE_OTHER): Payer: Medicaid Other | Admitting: Adult Health

## 2013-08-23 ENCOUNTER — Other Ambulatory Visit: Payer: Medicaid Other

## 2013-08-23 VITALS — BP 110/60 | Ht 64.0 in | Wt 110.0 lb

## 2013-08-23 DIAGNOSIS — Z309 Encounter for contraceptive management, unspecified: Secondary | ICD-10-CM

## 2013-08-23 DIAGNOSIS — Z3049 Encounter for surveillance of other contraceptives: Secondary | ICD-10-CM

## 2013-08-23 DIAGNOSIS — Z3202 Encounter for pregnancy test, result negative: Secondary | ICD-10-CM

## 2013-08-23 LAB — HCG, QUANTITATIVE, PREGNANCY: hCG, Beta Chain, Quant, S: <1 m[IU]/mL

## 2013-08-23 MED ORDER — MEDROXYPROGESTERONE ACETATE 150 MG/ML IM SUSP
150.0000 mg | Freq: Once | INTRAMUSCULAR | Status: AC
  Administered 2013-08-23: 150 mg via INTRAMUSCULAR

## 2013-08-23 NOTE — Progress Notes (Signed)
Patient ID: Autumn EchevariaJacquelyn L Herrera, female   DOB: October 06, 1985, 28 y.o.   MRN: [id number] Depo provera 150 mg given left deltoid, with no complications. Negative QHCG this am. Pt to return in 12 weeks for next injection.

## 2013-11-06 ENCOUNTER — Emergency Department (HOSPITAL_COMMUNITY)
Admission: EM | Admit: 2013-11-06 | Discharge: 2013-11-07 | Disposition: A | Discharge status: Home / Self Care | Payer: Medicaid Other | Attending: Emergency Medicine | Admitting: Emergency Medicine

## 2013-11-06 ENCOUNTER — Emergency Department (HOSPITAL_COMMUNITY): Payer: Medicaid Other

## 2013-11-06 ENCOUNTER — Encounter (HOSPITAL_COMMUNITY): Payer: Self-pay | Admitting: Emergency Medicine

## 2013-11-06 DIAGNOSIS — Z91040 Latex allergy status: Secondary | ICD-10-CM | POA: Insufficient documentation

## 2013-11-06 DIAGNOSIS — X500XXA Overexertion from strenuous movement or load, initial encounter: Secondary | ICD-10-CM | POA: Insufficient documentation

## 2013-11-06 DIAGNOSIS — Y929 Unspecified place or not applicable: Secondary | ICD-10-CM | POA: Insufficient documentation

## 2013-11-06 DIAGNOSIS — Z880 Allergy status to penicillin: Secondary | ICD-10-CM | POA: Insufficient documentation

## 2013-11-06 DIAGNOSIS — M5416 Radiculopathy, lumbar region: Secondary | ICD-10-CM

## 2013-11-06 DIAGNOSIS — Z8659 Personal history of other mental and behavioral disorders: Secondary | ICD-10-CM | POA: Insufficient documentation

## 2013-11-06 DIAGNOSIS — R209 Unspecified disturbances of skin sensation: Secondary | ICD-10-CM | POA: Insufficient documentation

## 2013-11-06 DIAGNOSIS — F17200 Nicotine dependence, unspecified, uncomplicated: Secondary | ICD-10-CM | POA: Insufficient documentation

## 2013-11-06 DIAGNOSIS — R296 Repeated falls: Secondary | ICD-10-CM | POA: Insufficient documentation

## 2013-11-06 DIAGNOSIS — Z87440 Personal history of urinary (tract) infections: Secondary | ICD-10-CM | POA: Insufficient documentation

## 2013-11-06 DIAGNOSIS — J45909 Unspecified asthma, uncomplicated: Secondary | ICD-10-CM | POA: Insufficient documentation

## 2013-11-06 DIAGNOSIS — Z8619 Personal history of other infectious and parasitic diseases: Secondary | ICD-10-CM | POA: Insufficient documentation

## 2013-11-06 DIAGNOSIS — Z3202 Encounter for pregnancy test, result negative: Secondary | ICD-10-CM | POA: Insufficient documentation

## 2013-11-06 DIAGNOSIS — IMO0002 Reserved for concepts with insufficient information to code with codable children: Secondary | ICD-10-CM | POA: Insufficient documentation

## 2013-11-06 DIAGNOSIS — Y9389 Activity, other specified: Secondary | ICD-10-CM | POA: Insufficient documentation

## 2013-11-06 LAB — POC URINE PREG, ED: Preg Test, Ur: NEGATIVE

## 2013-11-06 MED ORDER — CYCLOBENZAPRINE HCL 10 MG PO TABS
10.0000 mg | ORAL_TABLET | Freq: Once | ORAL | Status: AC
  Administered 2013-11-06: 10 mg via ORAL
  Filled 2013-11-06: qty 1

## 2013-11-06 MED ORDER — KETOROLAC TROMETHAMINE 60 MG/2ML IM SOLN
60.0000 mg | Freq: Once | INTRAMUSCULAR | Status: AC
  Administered 2013-11-06: 60 mg via INTRAMUSCULAR
  Filled 2013-11-06: qty 2

## 2013-11-06 NOTE — ED Provider Notes (Signed)
CSN: [id number]     Arrival date & time 11/06/13  2146 History   First MD Initiated Contact with Patient 11/06/13 2242     Chief Complaint  Patient presents with  . Back Pain     (Consider location/radiation/quality/duration/timing/severity/associated sxs/prior Treatment) Patient is a [age] y.o. female presenting with back pain. The history is provided by the patient.  Back Pain Location:  Lumbar spine Quality:  Aching and shooting Radiates to:  R posterior upper leg, R thigh and R knee Pain severity:  Moderate Pain is:  Same all the time Onset quality:  Sudden Duration:  1 day Timing:  Constant Progression:  Worsening Chronicity:  New Context: falling and recent injury   Relieved by:  Bed rest Worsened by:  Standing and twisting Ineffective treatments:  NSAIDs, lying down and muscle relaxants Associated symptoms: leg pain, numbness, paresthesias and tingling   Associated symptoms: no abdominal pain, no abdominal swelling, no bladder incontinence, no bowel incontinence, no chest pain, no dysuria, no fever, no headaches, no pelvic pain, no perianal numbness, no weakness and no weight loss    Patient with hx of low back pain, c/o sudden onset of worsening pain to her right lower back after a fall that occurred yesterday.  She states the pain suddenly became worse after she felt a "pop" in her back that now causing pain, numbness and tingling that radiates down her right posterior leg to the level of her mid calf.  She denies incontinence/retention of the bowel or bladder, neck pain, dysuria, or LOC   Past Medical History  Diagnosis Date  . Degenerative disc disease, lumbar   . Disc herniation   . Pregnant   . Asthma   . Depression   . History of cesarean delivery, currently pregnant 11/08/2012  . UTI (lower urinary tract infection)   . Shingles 04/14/2013   Past Surgical History  Procedure Laterality Date  . Knee surgery    . Cesarean section    . Dilation and curettage of  uterus N/A 2010  . Cesarean section N/A 01/11/2013    Procedure: REPEAT CESAREAN SECTION;  Surgeon: Tilda BurrowJohn V Ferguson, MD;  Location: [facility];  Service: Obstetrics;  Laterality: N/A;  . Back surgery  2003    lumbar   Family History  Problem Relation Age of Onset  . Diabetes Paternal Grandfather   . Hypertension Paternal Grandfather   . Stroke Paternal Grandfather   . Hypertension Paternal Grandmother   . Diabetes Maternal Grandmother   . Hypertension Maternal Grandmother   . Cancer Maternal Grandmother     breast  . Diabetes Maternal Grandfather   . Hypertension Maternal Grandfather   . CAD Maternal Grandfather   . Hypertension Mother   . Cancer Mother     cervical   History  Substance Use Topics  . Smoking status: Current Every Day Smoker -- 1.00 packs/day for 11 years    Types: Cigarettes  . Smokeless tobacco: Never Used  . Alcohol Use: No   OB History   Grav Para Term Preterm Abortions TAB SAB Ect Mult Living   4 3 3  1  1   3      Review of Systems  Constitutional: Negative for fever and weight loss.  Respiratory: Negative for shortness of breath.   Cardiovascular: Negative for chest pain.  Gastrointestinal: Negative for vomiting, abdominal pain, constipation and bowel incontinence.  Genitourinary: Negative for bladder incontinence, dysuria, frequency, hematuria, flank pain, decreased urine volume, difficulty urinating and pelvic pain.  No perineal numbness or incontinence of urine or feces  Musculoskeletal: Positive for back pain. Negative for joint swelling, neck pain and neck stiffness.  Skin: Negative for rash.  Neurological: Positive for tingling, numbness and paresthesias. Negative for syncope, speech difficulty, weakness and headaches.  All other systems reviewed and are negative.      Allergies  Penicillins; Hydrocodone; Latex; and Pitocin  Home Medications   Current Outpatient Rx  Name  Route  Sig  Dispense  Refill  . acetaminophen (TYLENOL) 500  MG tablet   Oral   Take 500-1,000 mg by mouth every 6 (six) hours as needed for pain.          . cyclobenzaprine (FLEXERIL) 10 MG tablet   Oral   Take 10 mg by mouth once as needed for muscle spasms.         Marland Kitchen ibuprofen (ADVIL,MOTRIN) 200 MG tablet   Oral   Take 800 mg by mouth every 6 (six) hours as needed for fever, mild pain or moderate pain.         . medroxyPROGESTERone (DEPO-PROVERA) 150 MG/ML injection   Intramuscular   Inject 1 mL (150 mg total) into the muscle every 3 (three) months.   1 mL   4    BP 111/55  Pulse 78  Temp(Src) 97.5 F (36.4 C) (Oral)  Resp 16  Ht 5\' 4"  (1.626 m)  Wt 115 lb (52.164 kg)  BMI 19.73 kg/m2  SpO2 99% Physical Exam  Nursing note and vitals reviewed. Constitutional: She is oriented to person, place, and time. She appears well-developed and well-nourished. No distress.  HENT:  Head: Normocephalic and atraumatic.  Neck: Normal range of motion. Neck supple.  Cardiovascular: Normal rate, regular rhythm, normal heart sounds and intact distal pulses.   No murmur heard. Pulmonary/Chest: Effort normal and breath sounds normal. No respiratory distress.  Abdominal: Soft. She exhibits no distension. There is no tenderness.  Musculoskeletal: She exhibits tenderness. She exhibits no edema.       Lumbar back: She exhibits tenderness and pain. She exhibits normal range of motion, no bony tenderness, no swelling, no edema, no deformity, no laceration and normal pulse.       Back:  ttp of the right lumbar paraspinal muscles.  No spinal tenderness.  DP pulses are brisk and symmetrical.  Distal sensation intact.  Hip Flexors/Extensors are intact  Neurological: She is alert and oriented to person, place, and time. She has normal strength. No sensory deficit. She exhibits normal muscle tone. Coordination and gait normal.  Reflex Scores:      Patellar reflexes are 2+ on the right side and 2+ on the left side.      Achilles reflexes are 2+ on the right  side and 2+ on the left side. Skin: Skin is warm and dry. No rash noted.    ED Course  Procedures (including critical care time) Labs Review Labs Reviewed  URINALYSIS, ROUTINE W REFLEX MICROSCOPIC  POC URINE PREG, ED   Imaging Review Dg Lumbar Spine Complete  11/06/2013   CLINICAL DATA:  Fall, right greater than left low back pain  EXAM: LUMBAR SPINE - COMPLETE 4+ VIEW  COMPARISON:  None.  FINDINGS: Five non rib-bearing lumbar-type vertebral bodies are present. Mild dextroscoliosis of the lumbar spine noted. There is preservation of the normal lumbar lordosis. Vertebral body heights are well maintained. No acute fracture or listhesis. No paraspinous soft tissue abnormality. Sacrum is intact. No significant degenerative disc disease identified.  No  soft tissue abnormality.  IMPRESSION: No acute abnormality identified within the lumbar spine.   Electronically Signed   By: Rise Mu M.D.   On: 11/06/2013 23:39     EKG Interpretation None      MDM   Final diagnoses:  Lumbar radicular pain   Pt reviewed ont he [place] narcotics database.  No recent prescriptions filled.    Labs and XR results reviewed and discussed with patient.   Patient has ttp of the right lumbar paraspinal muscles.  No focal neuro deficits on exam.  Ambulates with a slightly antalgic gait.  Likely lumbar radiculopathy.  No concerning sx's for emergent neurological or infectious process at this time.  Pt is feeling better after treatment and appear stable for discharge.  Agrees to f/u with Dr. Hilda Lias.       Fredericka Bottcher L. Eimy Plaza, PA-C 11/07/13 0032

## 2013-11-06 NOTE — ED Notes (Signed)
Yesterday afternoonstepped in a hole and fell down a hill at home.  Today pain in lumbar spine radiating down R leg.  Numbness and tingling all the way down to toes.  Denies loss of bladder or bowel control.  Has been taking Flexeril and 800mg  Ibuprofem and using heating pad.  Movement causes stabbing pain.

## 2013-11-07 LAB — URINALYSIS, ROUTINE W REFLEX MICROSCOPIC
BILIRUBIN URINE: NEGATIVE
Glucose, UA: NEGATIVE mg/dL
HGB URINE DIPSTICK: NEGATIVE
Ketones, ur: NEGATIVE mg/dL
Leukocytes, UA: NEGATIVE
Nitrite: NEGATIVE
PROTEIN: NEGATIVE mg/dL
Specific Gravity, Urine: 1.015 (ref 1.005–1.030)
Urobilinogen, UA: 0.2 mg/dL (ref 0.0–1.0)
pH: 7.5 (ref 5.0–8.0)

## 2013-11-07 MED ORDER — PREDNISONE 10 MG PO TABS: ORAL_TABLET | ORAL | Status: DC

## 2013-11-07 MED ORDER — OXYCODONE-ACETAMINOPHEN 5-325 MG PO TABS: 1.0000 | ORAL_TABLET | ORAL | Status: DC | PRN

## 2013-11-07 MED ORDER — OXYCODONE-ACETAMINOPHEN 5-325 MG PO TABS
1.0000 | ORAL_TABLET | Freq: Once | ORAL | Status: AC
  Administered 2013-11-07: 1 via ORAL
  Filled 2013-11-07: qty 1

## 2013-11-07 MED ORDER — CYCLOBENZAPRINE HCL 10 MG PO TABS: 10.0000 mg | ORAL_TABLET | Freq: Three times a day (TID) | ORAL | Status: DC | PRN

## 2013-11-07 NOTE — Discharge Instructions (Signed)

## 2013-11-07 NOTE — ED Notes (Signed)
Patient with no complaints at this time. Respirations even and unlabored. Skin warm/dry. Discharge instructions reviewed with patient at this time. Patient given opportunity to voice concerns/ask questions. Patient discharged at this time and left Emergency Department with steady gait.   

## 2013-11-07 NOTE — ED Notes (Signed)
Ambulated unit.  Patient favoring R leg.  States pain is unchanged from presentation and still has numbness and tingling.

## 2013-11-09 NOTE — ED Provider Notes (Signed)
Medical screening examination/treatment/procedure(s) were performed by non-physician practitioner and as supervising physician I was immediately available for consultation/collaboration.   EKG Interpretation None        Richele Strand W Bryn Perkin, MD 11/09/13 1820 

## 2013-11-15 ENCOUNTER — Encounter: Payer: Self-pay | Admitting: Adult Health

## 2013-11-15 ENCOUNTER — Ambulatory Visit (INDEPENDENT_AMBULATORY_CARE_PROVIDER_SITE_OTHER): Payer: Medicaid Other | Admitting: Adult Health

## 2013-11-15 VITALS — BP 120/76 | Ht 64.0 in | Wt 116.0 lb

## 2013-11-15 DIAGNOSIS — Z309 Encounter for contraceptive management, unspecified: Secondary | ICD-10-CM

## 2013-11-15 DIAGNOSIS — Z3202 Encounter for pregnancy test, result negative: Secondary | ICD-10-CM

## 2013-11-15 DIAGNOSIS — Z3049 Encounter for surveillance of other contraceptives: Secondary | ICD-10-CM

## 2013-11-15 DIAGNOSIS — Z3200 Encounter for pregnancy test, result unknown: Secondary | ICD-10-CM

## 2013-11-15 LAB — POCT URINE PREGNANCY: PREG TEST UR: NEGATIVE

## 2013-11-15 MED ORDER — MEDROXYPROGESTERONE ACETATE 150 MG/ML IM SUSP
150.0000 mg | Freq: Once | INTRAMUSCULAR | Status: AC
  Administered 2013-11-15: 150 mg via INTRAMUSCULAR

## 2014-02-07 ENCOUNTER — Encounter: Payer: Self-pay | Admitting: Adult Health

## 2014-02-07 ENCOUNTER — Ambulatory Visit (INDEPENDENT_AMBULATORY_CARE_PROVIDER_SITE_OTHER): Payer: Medicaid Other | Admitting: Adult Health

## 2014-02-07 ENCOUNTER — Other Ambulatory Visit: Payer: Self-pay | Admitting: Adult Health

## 2014-02-07 DIAGNOSIS — Z3202 Encounter for pregnancy test, result negative: Secondary | ICD-10-CM

## 2014-02-07 DIAGNOSIS — Z3042 Encounter for surveillance of injectable contraceptive: Secondary | ICD-10-CM

## 2014-02-07 DIAGNOSIS — Z3049 Encounter for surveillance of other contraceptives: Secondary | ICD-10-CM

## 2014-02-07 LAB — POCT URINE PREGNANCY: Preg Test, Ur: NEGATIVE

## 2014-02-07 MED ORDER — MEDROXYPROGESTERONE ACETATE 150 MG/ML IM SUSP
150.0000 mg | Freq: Once | INTRAMUSCULAR | Status: AC
  Administered 2014-02-07: 150 mg via INTRAMUSCULAR

## 2014-02-07 NOTE — Telephone Encounter (Signed)
I called [facility] in [place] and they told me pt had 1 refill left on Depo Rx. I went ahead and gave a verbal for Depo Provera 150 mg injection, inject 1 ml into muscle every 3 months with 3 refills per Cyril MourningJennifer Griffin, NP. Pt aware to pick up at pharmacy. JSY

## 2014-02-15 ENCOUNTER — Emergency Department (HOSPITAL_COMMUNITY)
Admission: EM | Admit: 2014-02-15 | Discharge: 2014-02-15 | Disposition: A | Discharge status: Home / Self Care | Payer: Medicaid Other | Attending: Emergency Medicine | Admitting: Emergency Medicine

## 2014-02-15 ENCOUNTER — Encounter (HOSPITAL_COMMUNITY): Payer: Self-pay | Admitting: Emergency Medicine

## 2014-02-15 DIAGNOSIS — Z8659 Personal history of other mental and behavioral disorders: Secondary | ICD-10-CM | POA: Insufficient documentation

## 2014-02-15 DIAGNOSIS — M5137 Other intervertebral disc degeneration, lumbosacral region: Secondary | ICD-10-CM | POA: Insufficient documentation

## 2014-02-15 DIAGNOSIS — F17200 Nicotine dependence, unspecified, uncomplicated: Secondary | ICD-10-CM | POA: Insufficient documentation

## 2014-02-15 DIAGNOSIS — Z880 Allergy status to penicillin: Secondary | ICD-10-CM | POA: Insufficient documentation

## 2014-02-15 DIAGNOSIS — M51379 Other intervertebral disc degeneration, lumbosacral region without mention of lumbar back pain or lower extremity pain: Secondary | ICD-10-CM | POA: Insufficient documentation

## 2014-02-15 DIAGNOSIS — Z87440 Personal history of urinary (tract) infections: Secondary | ICD-10-CM | POA: Insufficient documentation

## 2014-02-15 DIAGNOSIS — J45909 Unspecified asthma, uncomplicated: Secondary | ICD-10-CM | POA: Insufficient documentation

## 2014-02-15 DIAGNOSIS — Z8619 Personal history of other infectious and parasitic diseases: Secondary | ICD-10-CM | POA: Insufficient documentation

## 2014-02-15 DIAGNOSIS — Z91040 Latex allergy status: Secondary | ICD-10-CM | POA: Insufficient documentation

## 2014-02-15 DIAGNOSIS — H109 Unspecified conjunctivitis: Secondary | ICD-10-CM

## 2014-02-15 MED ORDER — OXYCODONE-ACETAMINOPHEN 5-325 MG PO TABS: 1.0000 | ORAL_TABLET | ORAL | Status: AC | PRN

## 2014-02-15 MED ORDER — FLUORESCEIN SODIUM 1 MG OP STRP
1.0000 | ORAL_STRIP | Freq: Once | OPHTHALMIC | Status: AC
  Administered 2014-02-15: 1 via OPHTHALMIC
  Filled 2014-02-15: qty 1

## 2014-02-15 MED ORDER — TETRACAINE HCL 0.5 % OP SOLN
2.0000 [drp] | Freq: Once | OPHTHALMIC | Status: AC
  Administered 2014-02-15: 2 [drp] via OPHTHALMIC
  Filled 2014-02-15: qty 2

## 2014-02-15 MED ORDER — SULFACETAMIDE SODIUM 10 % OP SOLN: 2.0000 [drp] | OPHTHALMIC | Status: AC

## 2014-02-15 NOTE — Discharge Instructions (Signed)
Conjunctivitis Conjunctivitis is commonly called "pink eye." Conjunctivitis can be caused by bacterial or viral infection, allergies, or injuries. There is usually redness of the lining of the eye, itching, discomfort, and sometimes discharge. There may be deposits of matter along the eyelids. A viral infection usually causes a watery discharge, while a bacterial infection causes a yellowish, thick discharge. Pink eye is very contagious and spreads by direct contact. You may be given antibiotic eyedrops as part of your treatment. Before using your eye medicine, remove all drainage from the eye by washing gently with warm water and cotton balls. Continue to use the medication until you have awakened 2 mornings in a row without discharge from the eye. Do not rub your eye. This increases the irritation and helps spread infection. Use separate towels from other household members. Wash your hands with soap and water before and after touching your eyes. Use cold compresses to reduce pain and sunglasses to relieve irritation from light. Do not wear contact lenses or wear eye makeup until the infection is gone. SEEK MEDICAL CARE IF:   Your symptoms are not better after 3 days of treatment.  You have increased pain or trouble seeing.  The outer eyelids become very red or swollen. Document Released: 08/28/2004 Document Revised: 10/13/2011 Document Reviewed: 07/21/2005 [facility]ExitCare Patient Information 2015 [place]. This information is not intended to replace advice given to you by your health care provider. Make sure you discuss any questions you have with your health care provider.  Sulfacetamide eye solution What is this medicine? SULFACETAMIDE (sul fa SEE ta mide) is a sulfonamide antibiotic. It is used to treat eye infections. This medicine may be used for other purposes; ask your health care provider or pharmacist if you have questions. COMMON BRAND NAME(S): Bleph-10, Ocu-Sul, Sodium Sulamyd,  Sulf-10 What should I tell my health care provider before I take this medicine? They need to know if you have any of these conditions: -eye injury or eye surgery -an unusual or allergic reaction to sulfacetamide, sulfa drugs, other medicines, foods, dyes, or preservatives -pregnant or trying to get pregnant -breast-feeding How should I use this medicine? This medicine is only for use in the eye. Do not take by mouth. Follow the directions on the prescription label. Wash hands before and after use. Tilt your head back slightly and pull your lower eyelid down with your index finger to form a pouch. Try not to touch the tip of the dropper to your eye, fingertips, or any other surface. Squeeze the prescribed number of drops into the pouch. Close the eye gently to spread the drops. Your vision may blur for a few minutes. Use your doses at regular intervals. Do not use your medicine more often than directed. Finish the full course prescribed by your doctor or health care professional even if you think your condition is better. Talk to your pediatrician regarding the use of this medicine in children. Special care may be needed. Overdosage: If you think you have taken too much of this medicine contact a poison control center or emergency room at once. NOTE: This medicine is only for you. Do not share this medicine with others. What if I miss a dose? If you miss a dose, use it as soon as you can. If it is almost time for your next dose, use only that dose. Do not use double or extra doses. What may interact with this medicine? -eye products that contain silver This list may not describe all possible interactions.  Give your health care provider a list of all the medicines, herbs, non-prescription drugs, or dietary supplements you use. Also tell them if you smoke, drink alcohol, or use illegal drugs. Some items may interact with your medicine. What should I watch for while using this medicine? Tell your  doctor or health care professional if your symptoms do not get better in 2 to 3 days. A full course of treatment is usually 7 to 10 days. If you get any sign of an allergic reaction, stop using your eye product and call your doctor or health care professional. Wear sunglasses if this medicine makes your eyes more sensitive to light. Keep out of the sun, or wear protective clothing outdoors and use a sunscreen. Do not use sun lamps or sun tanning beds or booths. What side effects may I notice from receiving this medicine? Side effects that you should report to your doctor or health care professional as soon as possible: -blurred vision that does not go away -burning, blistering, peeling, stinging, or itching of the eyes or eyelids, skin or mouth -eye redness, swelling, or pain Side effects that usually do not require medical attention (report to your doctor or health care professional if they continue or are bothersome): -blurred vision for a few moments after application This list may not describe all possible side effects. Call your doctor for medical advice about side effects. You may report side effects to FDA at 1-800-FDA-1088. Where should I keep my medicine? Keep out of the reach of children. Store between 2 and 30 degrees C (36 and 86 degrees F). Do not freeze. Throw away any unused eye products after the expiration date. NOTE: This sheet is a summary. It may not cover all possible information. If you have questions about this medicine, talk to your doctor, pharmacist, or health care provider.  2015, Elsevier/Gold Standard. (2008-03-24 13:04:42)  Acetaminophen; Oxycodone tablets What is this medicine? ACETAMINOPHEN; OXYCODONE (a set a MEE noe fen; ox i KOE done) is a pain reliever. It is used to treat mild to moderate pain. This medicine may be used for other purposes; ask your health care provider or pharmacist if you have questions. COMMON BRAND NAME(S): Endocet, Magnacet, Narvox,  Percocet, Perloxx, Primalev, Primlev, Roxicet, Xolox What should I tell my health care provider before I take this medicine? They need to know if you have any of these conditions: -brain tumor -Crohn's disease, inflammatory bowel disease, or ulcerative colitis -drug abuse or addiction -head injury -heart or circulation problems -if you often drink alcohol -kidney disease or problems going to the bathroom -liver disease -lung disease, asthma, or breathing problems -an unusual or allergic reaction to acetaminophen, oxycodone, other opioid analgesics, other medicines, foods, dyes, or preservatives -pregnant or trying to get pregnant -breast-feeding How should I use this medicine? Take this medicine by mouth with a full glass of water. Follow the directions on the prescription label. Take your medicine at regular intervals. Do not take your medicine more often than directed. Talk to your pediatrician regarding the use of this medicine in children. Special care may be needed. Patients over 27 years old may have a stronger reaction and need a smaller dose. Overdosage: If you think you have taken too much of this medicine contact a poison control center or emergency room at once. NOTE: This medicine is only for you. Do not share this medicine with others. What if I miss a dose? If you miss a dose, take it as soon as you can.  If it is almost time for your next dose, take only that dose. Do not take double or extra doses. What may interact with this medicine? -alcohol -antihistamines -barbiturates like amobarbital, butalbital, butabarbital, methohexital, pentobarbital, phenobarbital, thiopental, and secobarbital -benztropine -drugs for bladder problems like solifenacin, trospium, oxybutynin, tolterodine, hyoscyamine, and methscopolamine -drugs for breathing problems like ipratropium and tiotropium -drugs for certain stomach or intestine problems like propantheline, homatropine methylbromide,  glycopyrrolate, atropine, belladonna, and dicyclomine -general anesthetics like etomidate, ketamine, nitrous oxide, propofol, desflurane, enflurane, halothane, isoflurane, and sevoflurane -medicines for depression, anxiety, or psychotic disturbances -medicines for sleep -muscle relaxants -naltrexone -narcotic medicines (opiates) for pain -phenothiazines like perphenazine, thioridazine, chlorpromazine, mesoridazine, fluphenazine, prochlorperazine, promazine, and trifluoperazine -scopolamine -tramadol -trihexyphenidyl This list may not describe all possible interactions. Give your health care provider a list of all the medicines, herbs, non-prescription drugs, or dietary supplements you use. Also tell them if you smoke, drink alcohol, or use illegal drugs. Some items may interact with your medicine. What should I watch for while using this medicine? Tell your doctor or health care professional if your pain does not go away, if it gets worse, or if you have new or a different type of pain. You may develop tolerance to the medicine. Tolerance means that you will need a higher dose of the medication for pain relief. Tolerance is normal and is expected if you take this medicine for a long time. Do not suddenly stop taking your medicine because you may develop a severe reaction. Your body becomes used to the medicine. This does NOT mean you are addicted. Addiction is a behavior related to getting and using a drug for a non-medical reason. If you have pain, you have a medical reason to take pain medicine. Your doctor will tell you how much medicine to take. If your doctor wants you to stop the medicine, the dose will be slowly lowered over time to avoid any side effects. You may get drowsy or dizzy. Do not drive, use machinery, or do anything that needs mental alertness until you know how this medicine affects you. Do not stand or sit up quickly, especially if you are an older patient. This reduces the risk  of dizzy or fainting spells. Alcohol may interfere with the effect of this medicine. Avoid alcoholic drinks. There are different types of narcotic medicines (opiates) for pain. If you take more than one type at the same time, you may have more side effects. Give your health care provider a list of all medicines you use. Your doctor will tell you how much medicine to take. Do not take more medicine than directed. Call emergency for help if you have problems breathing. The medicine will cause constipation. Try to have a bowel movement at least every 2 to 3 days. If you do not have a bowel movement for 3 days, call your doctor or health care professional. Do not take Tylenol (acetaminophen) or medicines that have acetaminophen with this medicine. Too much acetaminophen can be very dangerous. Many nonprescription medicines contain acetaminophen. Always read the labels carefully to avoid taking more acetaminophen. What side effects may I notice from receiving this medicine? Side effects that you should report to your doctor or health care professional as soon as possible: -allergic reactions like skin rash, itching or hives, swelling of the face, lips, or tongue -breathing difficulties, wheezing -confusion -light headedness or fainting spells -severe stomach pain -unusually weak or tired -yellowing of the skin or the whites of the eyes Side effects  that usually do not require medical attention (report to your doctor or health care professional if they continue or are bothersome): -dizziness -drowsiness -nausea -vomiting This list may not describe all possible side effects. Call your doctor for medical advice about side effects. You may report side effects to FDA at 1-800-FDA-1088. Where should I keep my medicine? Keep out of the reach of children. This medicine can be abused. Keep your medicine in a safe place to protect it from theft. Do not share this medicine with anyone. Selling or giving away  this medicine is dangerous and against the law. Store at room temperature between 20 and 25 degrees C (68 and 77 degrees F). Keep container tightly closed. Protect from light. This medicine may cause accidental overdose and death if it is taken by other adults, children, or pets. Flush any unused medicine down the toilet to reduce the chance of harm. Do not use the medicine after the expiration date. NOTE: This sheet is a summary. It may not cover all possible information. If you have questions about this medicine, talk to your doctor, pharmacist, or health care provider.  2015, Elsevier/Gold Standard. (2013-03-14 13:17:35)

## 2014-02-15 NOTE — ED Notes (Signed)
Pt c/o pain, swelling and blurred vision to the left eye. States it started in the right eye and has moved to the left eye and gotten progressively worse.

## 2014-02-15 NOTE — ED Provider Notes (Signed)
CSN: [id number]     Arrival date & time 02/15/14  0418 History   First MD Initiated Contact with Patient 02/15/14 0422     Chief Complaint  Patient presents with  . Eye Pain     (Consider location/radiation/quality/duration/timing/severity/associated sxs/prior Treatment) Patient is a 28 y.o. female presenting with eye pain. The history is provided by the patient.  Eye Pain  She noticed onset 2 days ago some irritation and foreign body sensation in her right eye which resolved but moved to her left thigh. She is now having severe pain in the left eye along with blurring of vision. There is a foreign body sensation in her eye. She feels better if she keeps her eyes closed. When she tries to flush her I. out with eye wash solution, and she gets some yellowish material. She denies any trauma denies any sick contacts.  Past Medical History  Diagnosis Date  . Degenerative disc disease, lumbar   . Disc herniation   . Pregnant   . Asthma   . Depression   . History of cesarean delivery, currently pregnant 11/08/2012  . UTI (lower urinary tract infection)   . Shingles 04/14/2013   Past Surgical History  Procedure Laterality Date  . Knee surgery    . Cesarean section    . Dilation and curettage of uterus N/A 2010  . Cesarean section N/A 01/11/2013    Procedure: REPEAT CESAREAN SECTION;  Surgeon: Tilda Burrow, MD;  Location: [facility];  Service: Obstetrics;  Laterality: N/A;  . Back surgery  2003    lumbar   Family History  Problem Relation Age of Onset  . Diabetes Paternal Grandfather   . Hypertension Paternal Grandfather   . Stroke Paternal Grandfather   . Hypertension Paternal Grandmother   . Diabetes Maternal Grandmother   . Hypertension Maternal Grandmother   . Cancer Maternal Grandmother     breast  . Diabetes Maternal Grandfather   . Hypertension Maternal Grandfather   . CAD Maternal Grandfather   . Hypertension Mother   . Cancer Mother     cervical   History  Substance  Use Topics  . Smoking status: Current Every Day Smoker -- 1.00 packs/day for 11 years    Types: Cigarettes  . Smokeless tobacco: Never Used  . Alcohol Use: No   OB History   Grav Para Term Preterm Abortions TAB SAB Ect Mult Living   4 3 3  1  1   3      Review of Systems  Eyes: Positive for pain.  All other systems reviewed and are negative.     Allergies  Penicillins; Hydrocodone; Latex; and Pitocin  Home Medications   Prior to Admission medications   Medication Sig Start Date End Date Taking? Authorizing Provider  acetaminophen (TYLENOL) 500 MG tablet Take 500-1,000 mg by mouth every 6 (six) hours as needed for pain.     Historical Provider, MD  acyclovir (ZOVIRAX) 800 MG tablet TAKE 5 X DAILY 02/07/14   Adline Potter, NP  ibuprofen (ADVIL,MOTRIN) 200 MG tablet Take 800 mg by mouth every 6 (six) hours as needed for fever, mild pain or moderate pain.    Historical Provider, MD  medroxyPROGESTERone (DEPO-PROVERA) 150 MG/ML injection Inject 1 mL (150 mg total) into the muscle every 3 (three) months. 02/24/13   Adline Potter, NP   BP 134/83  Pulse 112  Temp(Src) 98.2 F (36.8 C) (Oral)  Resp 22  Ht 5\' 4"  (1.626 m)  Wt  125 lb (56.7 kg)  BMI 21.45 kg/m2  SpO2 100% Physical Exam  Nursing note and vitals reviewed.  28 year old female, who appears uncomfortable, but is in no acute distress. Vital signs are significant for tachycardia very 112, and tachypnea with respiratory rate of 22. Oxygen saturation is 100%, which is normal. Head is normocephalic and atraumatic. PERRLA, EOMI. Oropharynx is clear. There is mild erythema the conjunctiva of the right eye and moderate erythema the conjunctiva of the left eye. Slit-lamp examination shows no corneal foreign bodies. Anterior chambers clear without cells or ciliary flare. Examination with cobalt blue filter after staining with fluorescein shows no abnormal areas of uptake. Neck is nontender and supple without adenopathy or  JVD. Back is nontender and there is no CVA tenderness. Lungs are clear without rales, wheezes, or rhonchi. Chest is nontender. Heart has regular rate and rhythm without murmur. Abdomen is soft, flat, nontender without masses or hepatosplenomegaly and peristalsis is normoactive. Extremities have no cyanosis or edema, full range of motion is present. Skin is warm and dry without rash. Neurologic: Mental status is normal, cranial nerves are intact, there are no motor or sensory deficits.  ED Course  Procedures (including critical care time)  MDM   Final diagnoses:  Conjunctivitis, left eye    Eye irritation in a pattern that seems most consistent with viral conjunctivitis. Of note, symptoms started in one eye and then moved to the other. No findings to suggest iritis or foreign body. Visual acuity was decreased in her left eye and 20/100 which did improve slightly to 20/70 after topical anesthesia with tetracaine. Decreased vision it is my only concern right now so she will be sent to ophthalmology for followup in 2 days. She is discharged with prescriptions for sulfacetamide ophthalmic solution and oxycodone-acetaminophen for pain.    Dione Boozeavid Taytum Scheck, MD 02/15/14 [phone]

## 2014-02-15 NOTE — ED Notes (Signed)
MD at bedside. 

## 2014-02-20 MED FILL — Oxycodone w/ Acetaminophen Tab 5-325 MG: ORAL | Qty: 6 | Status: AC

## 2014-04-09 IMAGING — CR DG KNEE COMPLETE 4+V*L*
4 series · 4 of 4 positions shown · non-contrast
Comparison: None.

CLINICAL DATA: Knee pain

LEFT KNEE - COMPLETE 4+ VIEW

[view not recorded (1 of 4)]
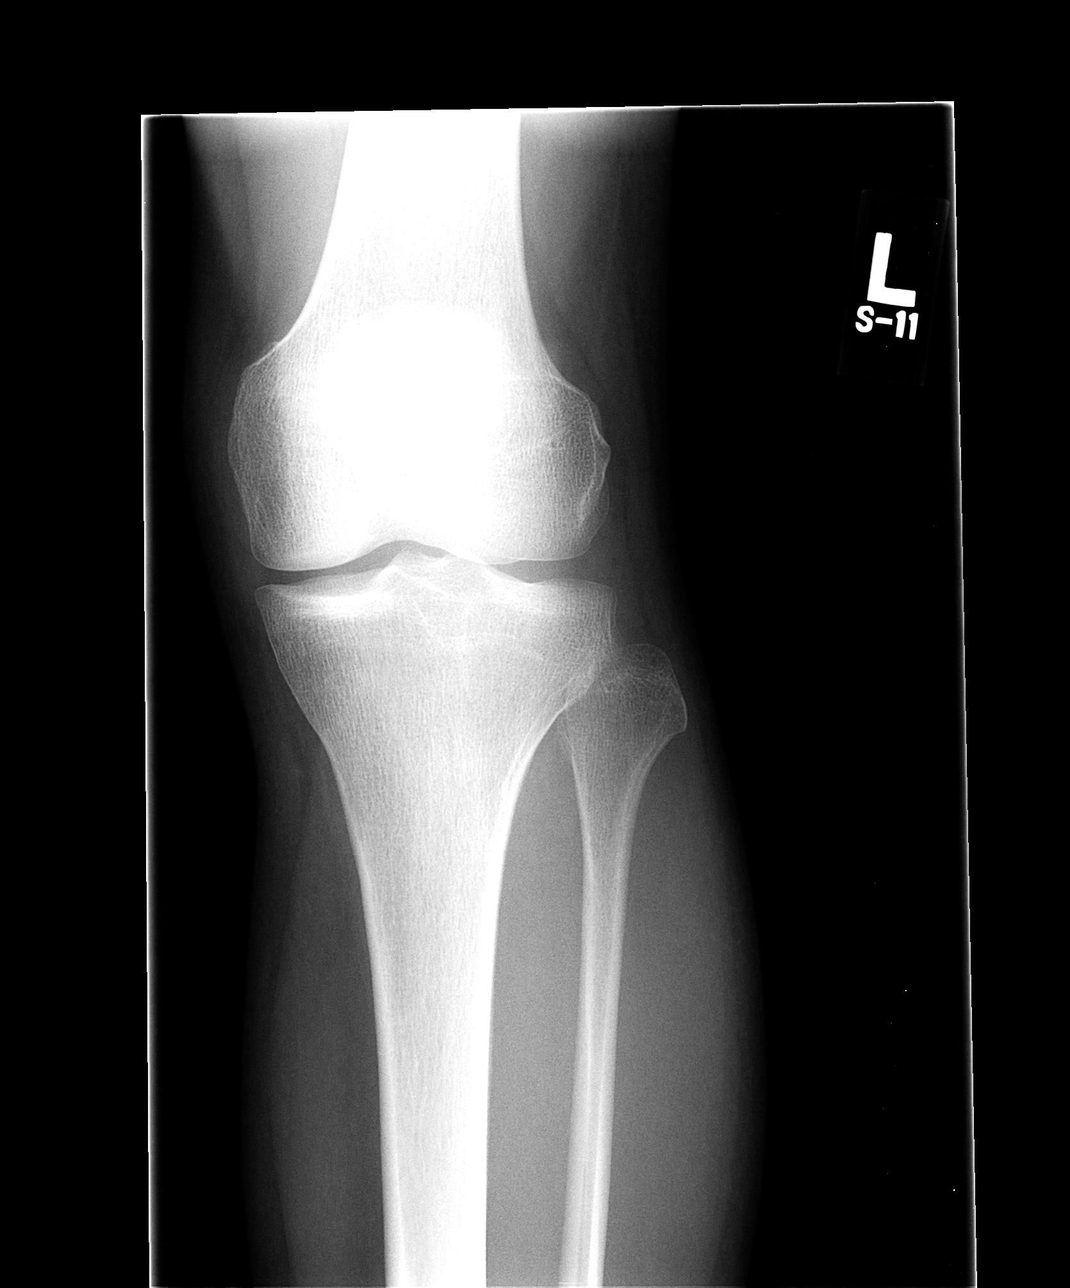

[view not recorded (2 of 4)]
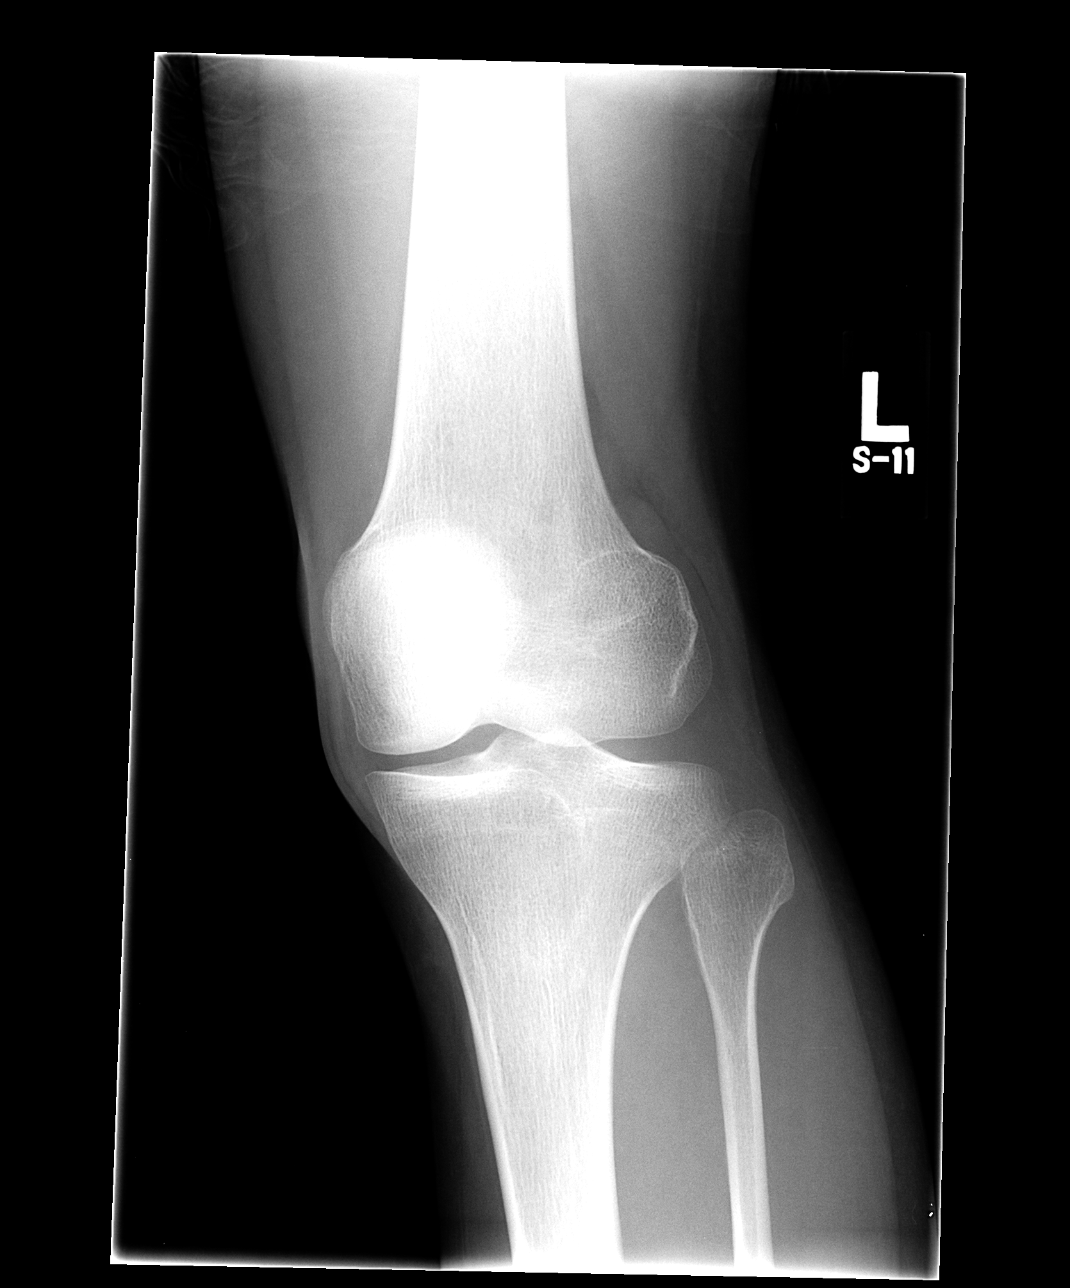

[view not recorded (3 of 4)]
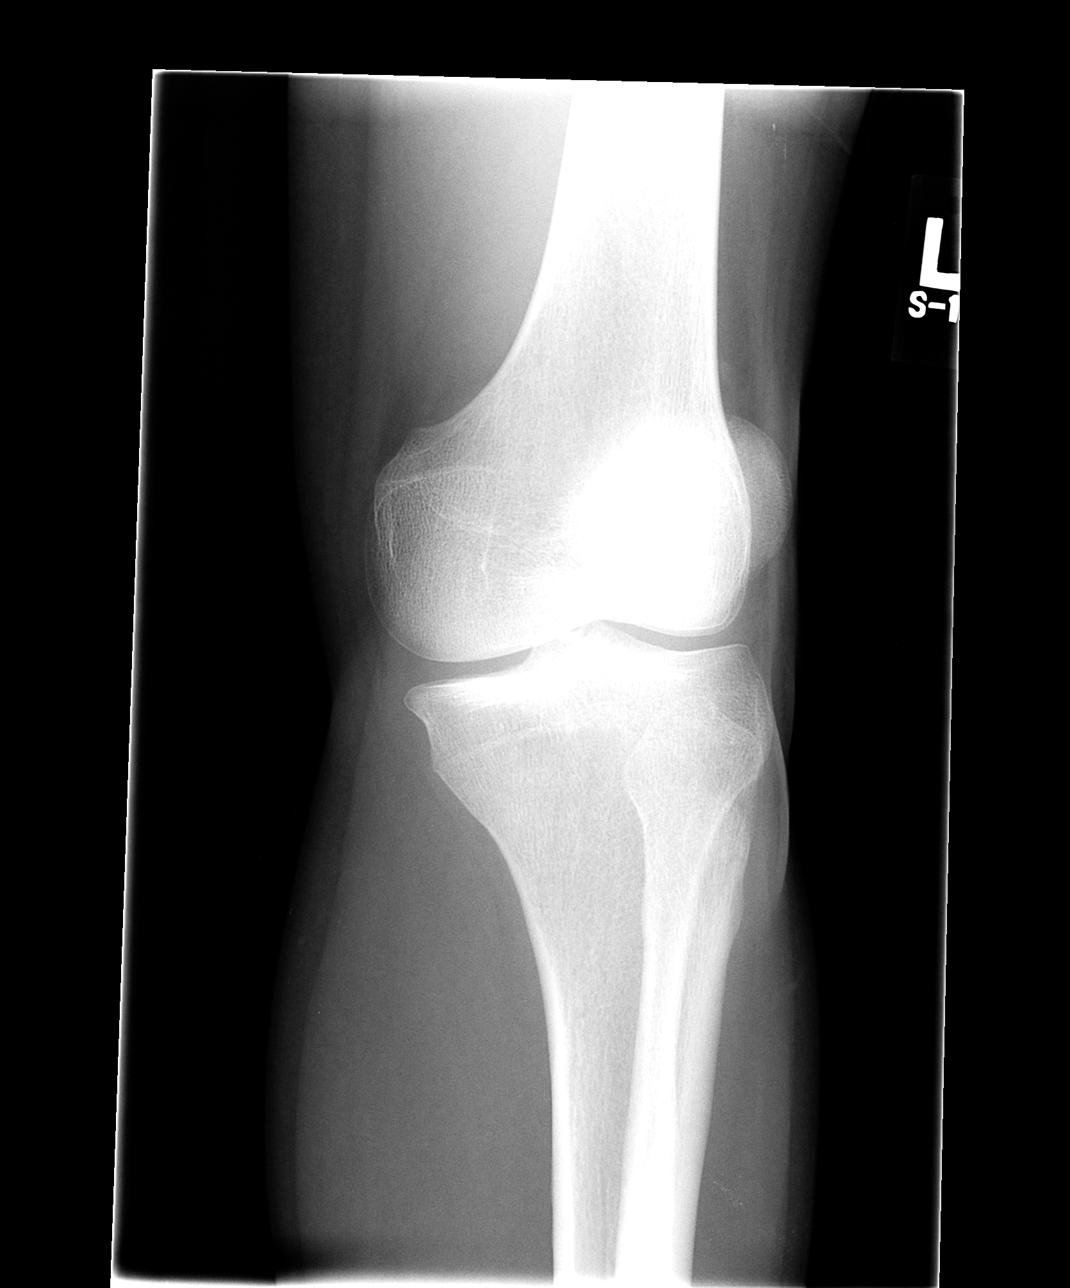

[view not recorded (4 of 4)]
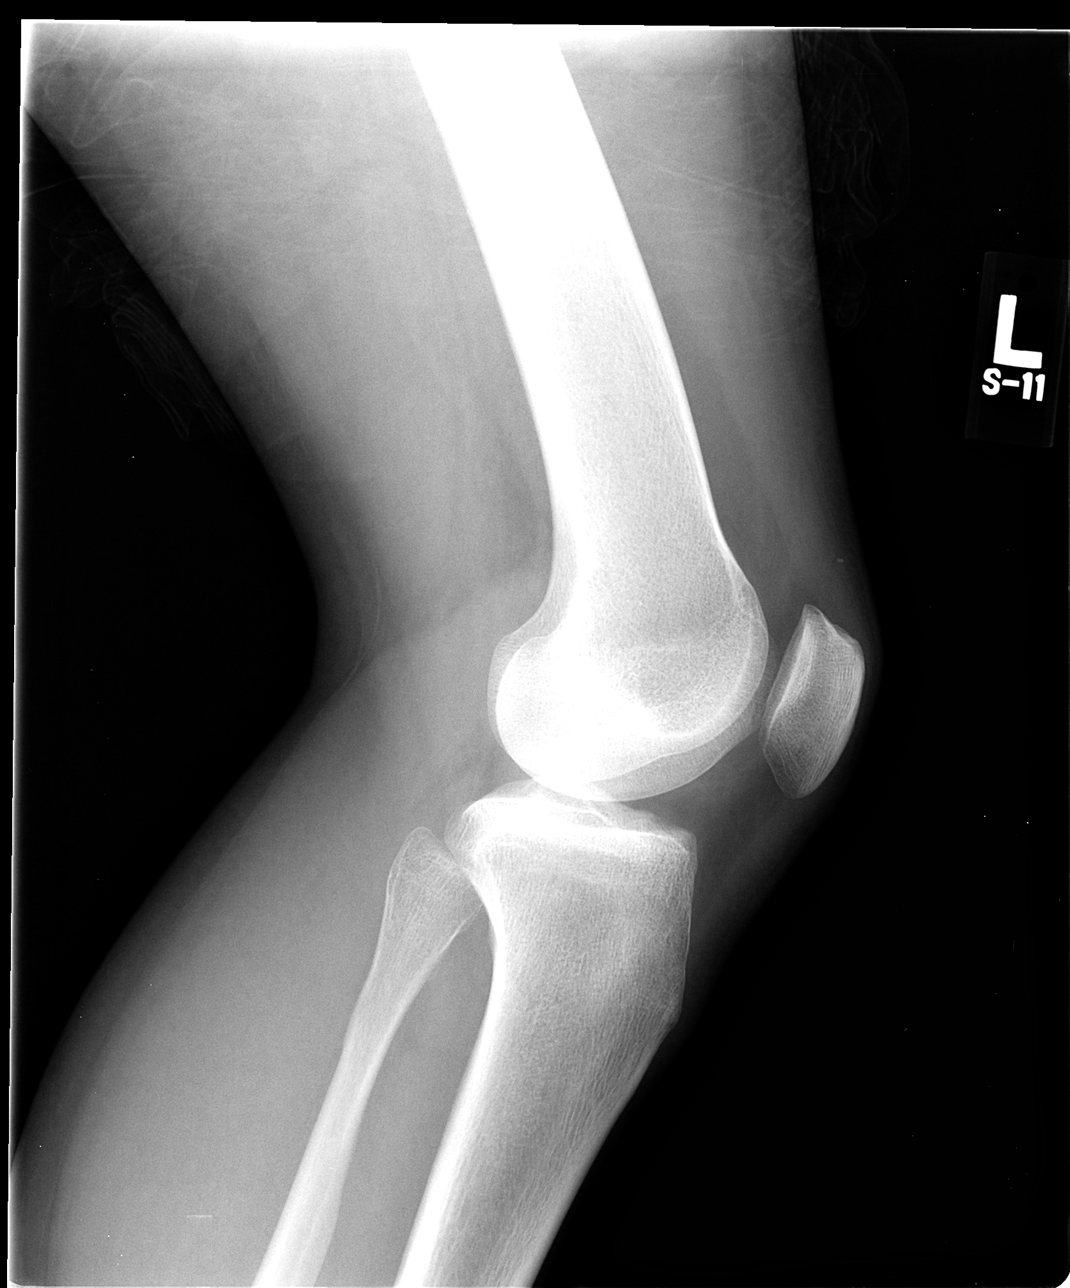

[4 of 4 positions shown; findings below may reference images not displayed]

FINDINGS: Four views of the left knee submitted.  No acute fracture
or subluxation.  No radiopaque foreign body.  No joint effusion.
IMPRESSION: No acute fracture or subluxation.

## 2014-05-02 ENCOUNTER — Ambulatory Visit: Payer: Self-pay

## 2014-06-05 ENCOUNTER — Encounter (HOSPITAL_COMMUNITY): Payer: Self-pay | Admitting: Emergency Medicine

## 2015-03-14 ENCOUNTER — Emergency Department (HOSPITAL_COMMUNITY)
Admission: EM | Admit: 2015-03-14 | Discharge: 2015-03-14 | Disposition: A | Discharge status: Home / Self Care | Payer: Medicaid Other | Attending: Emergency Medicine | Admitting: Emergency Medicine

## 2015-03-14 ENCOUNTER — Encounter (HOSPITAL_COMMUNITY): Payer: Self-pay | Admitting: Emergency Medicine

## 2015-03-14 DIAGNOSIS — Z880 Allergy status to penicillin: Secondary | ICD-10-CM | POA: Insufficient documentation

## 2015-03-14 DIAGNOSIS — Z91040 Latex allergy status: Secondary | ICD-10-CM | POA: Insufficient documentation

## 2015-03-14 DIAGNOSIS — J45909 Unspecified asthma, uncomplicated: Secondary | ICD-10-CM | POA: Insufficient documentation

## 2015-03-14 DIAGNOSIS — Z87440 Personal history of urinary (tract) infections: Secondary | ICD-10-CM | POA: Insufficient documentation

## 2015-03-14 DIAGNOSIS — Z720 Tobacco use: Secondary | ICD-10-CM | POA: Insufficient documentation

## 2015-03-14 DIAGNOSIS — L282 Other prurigo: Secondary | ICD-10-CM

## 2015-03-14 DIAGNOSIS — Z8659 Personal history of other mental and behavioral disorders: Secondary | ICD-10-CM | POA: Insufficient documentation

## 2015-03-14 DIAGNOSIS — Z8619 Personal history of other infectious and parasitic diseases: Secondary | ICD-10-CM | POA: Insufficient documentation

## 2015-03-14 DIAGNOSIS — L299 Pruritus, unspecified: Secondary | ICD-10-CM | POA: Insufficient documentation

## 2015-03-14 DIAGNOSIS — Z8739 Personal history of other diseases of the musculoskeletal system and connective tissue: Secondary | ICD-10-CM | POA: Insufficient documentation

## 2015-03-14 MED ORDER — FAMOTIDINE 20 MG PO TABS
20.0000 mg | ORAL_TABLET | Freq: Once | ORAL | Status: DC
  Filled 2015-03-14: qty 1

## 2015-03-14 MED ORDER — FAMOTIDINE IN NACL 20-0.9 MG/50ML-% IV SOLN
20.0000 mg | Freq: Once | INTRAVENOUS | Status: AC
  Administered 2015-03-14: 20 mg via INTRAVENOUS
  Filled 2015-03-14: qty 50

## 2015-03-14 MED ORDER — METHYLPREDNISOLONE SODIUM SUCC 125 MG IJ SOLR
125.0000 mg | Freq: Once | INTRAMUSCULAR | Status: AC
  Administered 2015-03-14: 125 mg via INTRAVENOUS
  Filled 2015-03-14: qty 2

## 2015-03-14 MED ORDER — PREDNISONE 20 MG PO TABS: ORAL_TABLET | ORAL | Status: AC

## 2015-03-14 MED ORDER — DIPHENHYDRAMINE HCL 50 MG/ML IJ SOLN
50.0000 mg | Freq: Once | INTRAMUSCULAR | Status: AC
  Administered 2015-03-14: 50 mg via INTRAVENOUS
  Filled 2015-03-14: qty 1

## 2015-03-14 MED ORDER — FAMOTIDINE 20 MG PO TABS: 20.0000 mg | ORAL_TABLET | Freq: Two times a day (BID) | ORAL | Status: AC

## 2015-03-14 NOTE — ED Provider Notes (Signed)
CSN: [id number]     Arrival date & time 03/14/15  0123 History   First MD Initiated Contact with Patient 03/14/15 0205    Chief Complaint  Patient presents with  . Rash     (Consider location/radiation/quality/duration/timing/severity/associated sxs/prior Treatment) HPI  Patient reports August 7 in the afternoon she started having a few bumps in her left axilla that she states were itchy. However since then the rash has spread. She states she has not been in a hotel or had any visitors. There is nothing new in her household including soaps, foods, or detergents. She states she's never had this before. She states 2 nights before it happened they did spend a lot of time outside however not the day before or the day that the rash started. She has been taking Benadryl orally and using Benadryl cream, using over-the-counter cortisone cream, and oatmeal baths without relief. Nobody else in her house has a rash.   [facility]   Past Medical History  Diagnosis Date  . Degenerative disc disease, lumbar   . Disc herniation   . Asthma   . Depression   . History of cesarean delivery, currently pregnant 11/08/2012  . UTI (lower urinary tract infection)   . Shingles 04/14/2013   Past Surgical History  Procedure Laterality Date  . Knee surgery    . Cesarean section    . Dilation and curettage of uterus N/A 2010  . Cesarean section N/A 01/11/2013    Procedure: REPEAT CESAREAN SECTION;  Surgeon: Tilda Burrow, MD;  Location: [facility];  Service: Obstetrics;  Laterality: N/A;  . Back surgery  2003    lumbar   Family History  Problem Relation Age of Onset  . Diabetes Paternal Grandfather   . Hypertension Paternal Grandfather   . Stroke Paternal Grandfather   . Hypertension Paternal Grandmother   . Diabetes Maternal Grandmother   . Hypertension Maternal Grandmother   . Cancer Maternal Grandmother     breast  . Diabetes Maternal Grandfather   . Hypertension Maternal  Grandfather   . CAD Maternal Grandfather   . Hypertension Mother   . Cancer Mother     cervical   Social History  Substance Use Topics  . Smoking status: Current Every Day Smoker -- 1.00 packs/day for 11 years    Types: Cigarettes  . Smokeless tobacco: Never Used  . 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     Allergies  Penicillins; Hydrocodone; Latex; and Pitocin  Home Medications   Prior to Admission medications   Medication Sig Start Date End Date Taking? Authorizing Provider  acetaminophen (TYLENOL) 500 MG tablet Take 500-1,000 mg by mouth every 6 (six) hours as needed for pain.    Yes Historical Provider, MD  ibuprofen (ADVIL,MOTRIN) 200 MG tablet Take 800 mg by mouth every 6 (six) hours as needed for fever, mild pain or moderate pain.   Yes Historical Provider, MD  acyclovir (ZOVIRAX) 800 MG tablet TAKE 5 X DAILY 02/07/14   Adline Potter, NP  famotidine (PEPCID) 20 MG tablet Take 1 tablet (20 mg total) by mouth 2 (two) times daily. 03/14/15   Devoria Albe, MD  medroxyPROGESTERone (  DEPO-PROVERA) 150 MG/ML injection Inject 1 mL (150 mg total) into the muscle every 3 (three) months. 02/24/13   Adline Potter, NP  oxyCODONE-acetaminophen (PERCOCET/ROXICET) 5-325 MG per tablet Take 1 tablet by mouth every 4 (four) hours as needed. 02/15/14   Dione Booze, MD  oxyCODONE-acetaminophen (PERCOCET/ROXICET) 5-325 MG per tablet Take 1 tablet by mouth every 4 (four) hours as needed. 02/15/14   Dione Booze, MD  predniSONE (DELTASONE) 20 MG tablet Take 3 po QD x 3d , then 2 po QD x 3d then 1 po QD x 3d 03/14/15   Devoria Albe, MD  sulfacetamide (BLEPH-10) 10 % ophthalmic solution Place 2 drops into the left eye every 2 (two) hours while awake. 02/15/14   Dione Booze, MD   BP  106/65 mmHg  Pulse 92  Temp(Src) 98.2 F (36.8 C)  Resp 18  Ht  (1.626 m)  Wt 125 lb (56.7 kg)  BMI 21.45 kg/m2  SpO2 97%  Vital signs normal   Physical Exam  Constitutional: She is oriented to person, place, and time. She appears well-developed and well-nourished.  Non-toxic appearance. She does not appear ill. No distress.  HENT:  Head: Normocephalic and atraumatic.  Right Ear: External ear normal.  Left Ear: External ear normal.  Nose: Nose normal. No mucosal edema or rhinorrhea.  Mouth/Throat: Oropharynx is clear and moist and mucous membranes are normal. No dental abscesses or uvula swelling.  Eyes: Conjunctivae and EOM are normal.  Neck: Normal range of motion and full passive range of motion without pain. Neck supple.  Pulmonary/Chest: Effort normal. No respiratory distress. She has no rhonchi. She exhibits no crepitus.  Abdominal: Soft. Normal appearance and bowel sounds are normal.  Musculoskeletal: Normal range of motion. She exhibits no edema or tenderness.  Moves all extremities well.   Neurological: She is alert and oriented to person, place, and time. She has normal strength. No cranial nerve deficit.  Skin: Skin is warm, dry and intact. Rash noted. No erythema. No pallor.  Patient has scattered individual small round red lesions approximately 1/2 cm in size with a small central vesicle that has the appearance of insect bite. These are mainly in her left axilla and left chest and left inner upper arm, a few in her right axilla, scattered on her proximal thighs medially and laterally. She has no involvement of the palms of her hands or in the webspaces of her fingers.  Psychiatric: She has a normal mood and affect. Her speech is normal and behavior is normal. Her mood appears not anxious.  Nursing note and vitals reviewed.   ED Course  Procedures (including critical care time)  Medications  diphenhydrAMINE (BENADRYL) injection 50 mg (50 mg Intravenous Given  03/14/15 0250)  methylPREDNISolone sodium succinate (SOLU-MEDROL) 125 mg/2 mL injection 125 mg (125 mg Intravenous Given 03/14/15 0250)  famotidine (PEPCID) IVPB 20 mg premix (0 mg Intravenous Stopped 03/14/15 0413)   Patient was started on IV steroidal, Benadryl, and Pepcid. The rash had the appearance of insect bites.  Patient was rechecked at 4:10 AM. She states she finally will has been able to sleep after not being able to sleep for 2 days. Her rash although not gone is faded in appearance.  Labs Review Labs Reviewed - No data to display  Imaging Review No results found.   EKG Interpretation None      MDM   Final diagnoses:  Pruritic rash    New Prescriptions   FAMOTIDINE (PEPCID) 20 MG TABLET  Take 1 tablet (20 mg total) by mouth 2 (two) times daily.   PREDNISONE (DELTASONE) 20 MG TABLET    Take 3 po QD x 3d , then 2 po QD x 3d then 1 po QD x 3d   OTC Benadryl or Zyrtec  Plan discharge  Devoria Albe, MD, Concha Pyo, MD 03/14/15 [phone]

## 2015-03-14 NOTE — Discharge Instructions (Signed)
Stay cool, heat will make the rash itch more. Take the medications as prescribed. You can take zyrtec 10 mg OTC once a day for itching and if needed you can add benadryl 25-50 mg every 6 hrs for itching.   Recheck if you get swelling in your throat or have trouble swallowing or breathing or the rash seems to be getting worse instead of better.  Rash A rash is a change in the color or feel of your skin. There are many different types of rashes. You may have other problems along with your rash. HOME CARE  Avoid the thing that caused your rash.  Do not scratch your rash.  You may take cools baths to help stop itching.  Only take medicines as told by your doctor.  Keep all doctor visits as told. GET HELP RIGHT AWAY IF:   Your pain, puffiness (swelling), or redness gets worse.  You have a fever.  You have new or severe problems.  You have body aches, watery poop (diarrhea), or you throw up (vomit).  Your rash is not better after 3 days. MAKE SURE YOU:   Understand these instructions.  Will watch your condition.  Will get help right away if you are not doing well or get worse. Document Released: 01/07/2008 Document Revised: 10/13/2011 Document Reviewed: 05/05/2011 [facility] Patient Information 2015 [place], [place]. This information is not intended to replace advice given to you by your health care provider. Make sure you discuss any questions you have with your health care provider.

## 2015-03-14 NOTE — ED Notes (Signed)
Pt c/o rash to body since Sunday.

## 2015-09-10 ENCOUNTER — Encounter: Payer: Self-pay | Admitting: *Deleted

## 2015-09-10 ENCOUNTER — Other Ambulatory Visit: Payer: Self-pay | Admitting: Obstetrics and Gynecology
